# Patient Record
Sex: Female | Born: 1970 | Race: White | Hispanic: No | Marital: Married | Smoking: Former smoker
Health system: Southern US, Community
[De-identification: ages and names within clinical notes are randomized; demographics above are authoritative.]

## PROBLEM LIST (undated history)

## (undated) DIAGNOSIS — F419 Anxiety disorder, unspecified: Secondary | ICD-10-CM

## (undated) DIAGNOSIS — Z720 Tobacco use: Secondary | ICD-10-CM

## (undated) DIAGNOSIS — K219 Gastro-esophageal reflux disease without esophagitis: Secondary | ICD-10-CM

## (undated) HISTORY — DX: Gastro-esophageal reflux disease without esophagitis: K21.9

## (undated) HISTORY — PX: BREAST SURGERY: SHX581

## (undated) HISTORY — PX: OTHER SURGICAL HISTORY: SHX169

## (undated) HISTORY — DX: Anxiety disorder, unspecified: F41.9

## (undated) HISTORY — DX: Tobacco use: Z72.0

---

## 1997-10-26 ENCOUNTER — Other Ambulatory Visit: Admission: RE | Admit: 1997-10-26 | Discharge: 1997-10-26 | Payer: Self-pay | Admitting: Obstetrics & Gynecology

## 1998-11-12 ENCOUNTER — Other Ambulatory Visit: Admission: RE | Admit: 1998-11-12 | Discharge: 1998-11-12 | Payer: Self-pay | Admitting: Obstetrics & Gynecology

## 2002-04-26 ENCOUNTER — Inpatient Hospital Stay (HOSPITAL_COMMUNITY): Admission: AD | Admit: 2002-04-26 | Discharge: 2002-04-26 | Payer: Self-pay | Admitting: Obstetrics & Gynecology

## 2002-04-26 ENCOUNTER — Ambulatory Visit (HOSPITAL_COMMUNITY): Admission: RE | Admit: 2002-04-26 | Discharge: 2002-04-26 | Payer: Self-pay | Admitting: Obstetrics & Gynecology

## 2002-04-26 ENCOUNTER — Encounter: Payer: Self-pay | Admitting: Obstetrics & Gynecology

## 2002-06-19 ENCOUNTER — Ambulatory Visit (HOSPITAL_COMMUNITY): Admission: RE | Admit: 2002-06-19 | Discharge: 2002-06-19 | Payer: Self-pay | Admitting: Obstetrics

## 2002-06-19 ENCOUNTER — Encounter: Payer: Self-pay | Admitting: Obstetrics

## 2002-07-27 ENCOUNTER — Encounter: Payer: Self-pay | Admitting: Obstetrics & Gynecology

## 2002-07-27 ENCOUNTER — Ambulatory Visit (HOSPITAL_COMMUNITY): Admission: RE | Admit: 2002-07-27 | Discharge: 2002-07-27 | Payer: Self-pay | Admitting: Obstetrics & Gynecology

## 2002-09-06 ENCOUNTER — Encounter: Payer: Self-pay | Admitting: Obstetrics & Gynecology

## 2002-09-06 ENCOUNTER — Ambulatory Visit (HOSPITAL_COMMUNITY): Admission: RE | Admit: 2002-09-06 | Discharge: 2002-09-06 | Payer: Self-pay | Admitting: Obstetrics & Gynecology

## 2002-10-02 ENCOUNTER — Observation Stay (HOSPITAL_COMMUNITY): Admission: EM | Admit: 2002-10-02 | Discharge: 2002-10-03 | Payer: Self-pay | Admitting: Obstetrics & Gynecology

## 2002-11-16 ENCOUNTER — Encounter: Payer: Self-pay | Admitting: Obstetrics

## 2002-11-16 ENCOUNTER — Inpatient Hospital Stay (HOSPITAL_COMMUNITY): Admission: RE | Admit: 2002-11-16 | Discharge: 2002-11-16 | Payer: Self-pay | Admitting: Obstetrics

## 2002-11-18 ENCOUNTER — Inpatient Hospital Stay (HOSPITAL_COMMUNITY): Admission: AD | Admit: 2002-11-18 | Discharge: 2002-11-21 | Payer: Self-pay | Admitting: Obstetrics

## 2004-07-21 ENCOUNTER — Emergency Department (HOSPITAL_COMMUNITY): Admission: EM | Admit: 2004-07-21 | Discharge: 2004-07-21 | Payer: Self-pay | Admitting: Emergency Medicine

## 2004-07-24 ENCOUNTER — Ambulatory Visit (HOSPITAL_COMMUNITY): Admission: RE | Admit: 2004-07-24 | Discharge: 2004-07-24 | Payer: Self-pay | Admitting: Obstetrics & Gynecology

## 2006-02-08 ENCOUNTER — Emergency Department (HOSPITAL_COMMUNITY): Admission: EM | Admit: 2006-02-08 | Discharge: 2006-02-08 | Payer: Self-pay | Admitting: Family Medicine

## 2006-03-23 HISTORY — PX: AUGMENTATION MAMMAPLASTY: SUR837

## 2008-03-02 ENCOUNTER — Ambulatory Visit: Payer: Self-pay

## 2008-05-04 ENCOUNTER — Inpatient Hospital Stay (HOSPITAL_COMMUNITY): Admission: AD | Admit: 2008-05-04 | Discharge: 2008-05-04 | Payer: Self-pay | Admitting: Obstetrics

## 2008-06-04 ENCOUNTER — Ambulatory Visit: Payer: Self-pay | Admitting: Obstetrics & Gynecology

## 2008-07-06 ENCOUNTER — Inpatient Hospital Stay (HOSPITAL_COMMUNITY): Admission: AD | Admit: 2008-07-06 | Discharge: 2008-07-08 | Payer: Self-pay | Admitting: Obstetrics & Gynecology

## 2008-07-06 ENCOUNTER — Encounter (INDEPENDENT_AMBULATORY_CARE_PROVIDER_SITE_OTHER): Payer: Self-pay | Admitting: Obstetrics

## 2010-04-13 ENCOUNTER — Encounter: Payer: Self-pay | Admitting: Obstetrics & Gynecology

## 2010-07-02 LAB — CBC
HCT: 30.4 % — ABNORMAL LOW (ref 36.0–46.0)
HCT: 34 % — ABNORMAL LOW (ref 36.0–46.0)
Hemoglobin: 10.4 g/dL — ABNORMAL LOW (ref 12.0–15.0)
Hemoglobin: 11.9 g/dL — ABNORMAL LOW (ref 12.0–15.0)
MCHC: 34.4 g/dL (ref 30.0–36.0)
MCHC: 34.9 g/dL (ref 30.0–36.0)
MCV: 87.6 fL (ref 78.0–100.0)
MCV: 88.6 fL (ref 78.0–100.0)
Platelets: 203 10*3/uL (ref 150–400)
RBC: 3.43 MIL/uL — ABNORMAL LOW (ref 3.87–5.11)
RBC: 3.88 MIL/uL (ref 3.87–5.11)
RDW: 12.7 % (ref 11.5–15.5)
WBC: 10.8 10*3/uL — ABNORMAL HIGH (ref 4.0–10.5)
WBC: 9.5 10*3/uL (ref 4.0–10.5)

## 2010-07-02 LAB — RH IMMUNE GLOB WKUP(>/=20WKS)(NOT WOMEN'S HOSP)

## 2010-07-08 LAB — RH IMMUNE GLOBULIN WORKUP (NOT WOMEN'S HOSP)
ABO/RH(D): O NEG
Antibody Screen: NEGATIVE

## 2010-08-08 NOTE — Op Note (Signed)
Penny Reid, Penny Reid            ACCOUNT NO.:  [id number]   MEDICAL RECORD NO.:  [id number]          PATIENT TYPE:  AMB   LOCATION:  SDC                           FACILITY:  [facility]   PHYSICIAN:  Roseanna Rainbow, M.D.DATE OF BIRTH:  02-28-1971   DATE OF PROCEDURE:  07/24/2004  DATE OF DISCHARGE:                                 OPERATIVE REPORT   PREOPERATIVE DIAGNOSIS:  Left sided labia majora abscess.   POSTOPERATIVE DIAGNOSIS:  Left sided labia majora abscess.   PROCEDURE:  Incision and drainage of a left labia majora abscess.   SURGEON:  Tamela Oddi.   ANESTHESIA:  Laryngeal mask airway.   COMPLICATIONS:  None.   ESTIMATED BLOOD LOSS:  Minimal.   FINDINGS:  There was purulent drainage noted from the previous cruciate  incision made over the abscess. There was a fluctuant mass approximately 2-3  cm in diameter at the most dependent portion of the left labia majora.   PROCEDURE:  The patient is taken to the operating room. She was placed in  the dorsal lithotomy position and prepped and draped in the usual sterile  fashion. The previous cruciate incision was then extended anteriorly and  posteriorly.  The abscessed cavity was then probed with mosquito clamps.  The abscess cavity was evacuated. The cavity was then irrigated. The cavity  then was packed. At the close of the procedure the instrument and pack  counts were said to be correct x2. The patient was taken to the PACU in  stable condition.      LAJ/MEDQ  D:  07/24/2004  T:  07/24/2004  Job:  [id number]

## 2010-08-08 NOTE — H&P (Signed)
Penny Reid, [name]            ACCOUNT NO.:  [id number]   MEDICAL RECORD NO.:  [id number]          PATIENT TYPE:  AMB   LOCATION:  SDC                           [facility]:  [facility]   PHYSICIAN:  Roseanna Rainbow, M.D.DATE OF BIRTH:  Aug 19, 1970   DATE OF ADMISSION:  DATE OF DISCHARGE:                                HISTORY & PHYSICAL   CHIEF COMPLAINT:  The patient is a 40 year old Caucasian female with a left  sided labial abscess who presents for incision and drainage.   HISTORY OF PRESENT ILLNESS:  Please see the above.  The patient reports  swelling of the left labia and pain for several days.  She is status post an  attempt at I&D in the office two days prior.  She has no previous history of  any other labial abscesses.   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   PAST OBSTETRICAL/GYNECOLOGIC HISTORY:  1.  Please see the above.  2.  She is status post two NSVs.   PAST MEDICAL HISTORY:  She denies.   MEDICATIONS:  Augmentin, ibuprofen.   ALLERGIES:  No known drug allergies.   SOCIAL HISTORY:  She reports current tobacco use.  Negative for alcohol or  recreational drugs.  She is married.   FAMILY HISTORY:  Diabetes mellitus.   PHYSICAL EXAMINATION:  VITAL SIGNS:  Stable afebrile.  GENERAL:  A well-developed, well-nourished, Caucasian female in no apparent  distress.  LUNGS:  Clear to auscultation bilaterally.  HEART:  Regular rate and rhythm.  ABDOMEN:  No organomegaly.  PELVIC:  The left labia majus is erythematous.  There is a fluctuant mass in  the most dependent portion measuring about 2- to -3-cm in diameter that is  tender and draining purulent material.   ASSESSMENT:  Left sided labial abscess.   PLAN:  Incision and drainage under anesthesia.  The risks, benefits, and  alternatives forms of management were reviewed with the patient and informed  consent has been obtained.      LAJ/MEDQ  D:  07/23/2004  T:  07/23/2004   Job:  [id number]

## 2010-08-08 NOTE — Discharge Summary (Signed)
Penny Reid, Penny Reid                        ACCOUNT NO.:  [id number]   MEDICAL RECORD NO.:  [id number]                   PATIENT TYPE:  INP   LOCATION:  9138                                 [facility]ACILITY:  [facility]   PHYSICIAN:  Charles A. Clearance Coots, M.D.             DATE OF BIRTH:  25-Jan-1971   DATE OF ADMISSION:  11/18/2002  DATE OF DISCHARGE:  11/21/2002                                 DISCHARGE SUMMARY   ADMITTING DIAGNOSES:  1. Term pregnancy.  2. Active labor.   DISCHARGE DIAGNOSES:  1. Term pregnancy.  2. Active labor.  3. Status post normal spontaneous vaginal delivery viable female infant on     November 19, 2002 at 0542.  Apgars of 9 at one minute, 9 at five minutes.     Weight of 3500 g.  Length of 53.5 cm.  Mother and infant discharged home     in good condition.   REASON FOR ADMISSION:  A 40 year old white female G2, P0-1-0-1 with  estimated date of confinement of November 14, 2002 presented to [facility] with rupture of membranes on the night of November 18, 2002.  She  progressed to full dilatation by the following morning and had a normal  spontaneous vaginal delivery of viable infant without complications.  Postpartum course was uncomplicated and the patient was discharged home on  postpartum day #2 in good condition.   PAST SURGICAL HISTORY:  Cryo cautery of the cervix for cervical dysplasia.   PAST MEDICAL HISTORY:  None.   MEDICATIONS:  Prenatal vitamins.   ALLERGIES:  No known drug allergies.   SOCIAL HISTORY:  Positive tobacco.  Negative for alcohol or recreational  drugs.   PHYSICAL EXAMINATION:  GENERAL:  Well-nourished, well-developed white female  in no acute distress.  HEENT:  Within normal limits.  LUNGS:  Clear to auscultation bilaterally.  HEART:  Regular rate and rhythm.  ABDOMEN:  Gravid, soft, nontender.  PELVIC:  Cervix:  Anterior lip, 100% effaced and the vertex was at a -1  station.   HOSPITAL COURSE:  As stated in the narrative, the  patient progressed very  rapidly thereafter to a normal spontaneous vaginal delivery without  complications.  The postoperative course was also uncomplicated.  She was  discharged home on postpartum day #2.   ADMITTING LABORATORY VALUES:  Hemoglobin 11.8, hematocrit 34.2, white blood  cell count 10,200, platelets 240,000.  RPR was nonreactive.   DISCHARGE LABORATORY VALUES:  Hemoglobin 11.4, hematocrit 33.0, white blood  cell count 13,900, platelets 176,000.   DISCHARGE DISPOSITION:  Continue prenatal vitamins.  Ibuprofen 800 mg q.8h.  as needed for pain.  Routine written obstetrical instructions per booklet  were given to the patient prior to discharge.  She is to call the office for  a follow-up appointment in six weeks.  Charles A. Clearance Coots, M.D.    CAH/MEDQ  D:  11/21/2002  T:  11/21/2002  Job:  [id number]

## 2010-08-08 NOTE — H&P (Signed)
NAMEGRETTELL, Penny Reid            ACCOUNT NO.:  [id number]   MEDICAL RECORD NO.:  [id number]           PATIENT TYPE:   LOCATION:                                 FACILITY:   PHYSICIAN:  Roseanna Rainbow, M.D.DATE OF BIRTH:  Nov 12, 1970   DATE OF ADMISSION:  DATE OF DISCHARGE:                                HISTORY & PHYSICAL   CHIEF COMPLAINT:  The patient is a 40 year old Caucasian female with a left-  sided labial abscess who presents for incision and drainage.   HISTORY OF PRESENT ILLNESS:  Dictation ended at this point.      LAJ/MEDQ  D:  07/23/2004  T:  07/23/2004  Job:  [id number]

## 2012-06-28 ENCOUNTER — Emergency Department (HOSPITAL_COMMUNITY)
Admission: EM | Admit: 2012-06-28 | Discharge: 2012-06-28 | Disposition: A | Discharge status: Home / Self Care | Attending: Emergency Medicine | Admitting: Emergency Medicine

## 2012-06-28 ENCOUNTER — Emergency Department (HOSPITAL_COMMUNITY)

## 2012-06-28 ENCOUNTER — Encounter (HOSPITAL_COMMUNITY): Payer: Self-pay | Admitting: *Deleted

## 2012-06-28 DIAGNOSIS — S0180XA Unspecified open wound of other part of head, initial encounter: Secondary | ICD-10-CM | POA: Insufficient documentation

## 2012-06-28 DIAGNOSIS — R110 Nausea: Secondary | ICD-10-CM | POA: Insufficient documentation

## 2012-06-28 DIAGNOSIS — Z23 Encounter for immunization: Secondary | ICD-10-CM | POA: Insufficient documentation

## 2012-06-28 DIAGNOSIS — Y92838 Other recreation area as the place of occurrence of the external cause: Secondary | ICD-10-CM | POA: Insufficient documentation

## 2012-06-28 DIAGNOSIS — F17200 Nicotine dependence, unspecified, uncomplicated: Secondary | ICD-10-CM | POA: Insufficient documentation

## 2012-06-28 DIAGNOSIS — W19XXXA Unspecified fall, initial encounter: Secondary | ICD-10-CM

## 2012-06-28 DIAGNOSIS — S0990XA Unspecified injury of head, initial encounter: Secondary | ICD-10-CM | POA: Insufficient documentation

## 2012-06-28 DIAGNOSIS — Y9239 Other specified sports and athletic area as the place of occurrence of the external cause: Secondary | ICD-10-CM | POA: Insufficient documentation

## 2012-06-28 DIAGNOSIS — IMO0002 Reserved for concepts with insufficient information to code with codable children: Secondary | ICD-10-CM | POA: Insufficient documentation

## 2012-06-28 DIAGNOSIS — Y9353 Activity, golf: Secondary | ICD-10-CM | POA: Insufficient documentation

## 2012-06-28 MED ORDER — TETANUS-DIPHTH-ACELL PERTUSSIS 5-2.5-18.5 LF-MCG/0.5 IM SUSP
0.5000 mL | Freq: Once | INTRAMUSCULAR | Status: AC
  Administered 2012-06-28: 0.5 mL via INTRAMUSCULAR
  Filled 2012-06-28: qty 0.5

## 2012-06-28 MED ORDER — ACETAMINOPHEN 325 MG PO TABS
650.0000 mg | ORAL_TABLET | Freq: Once | ORAL | Status: AC
  Administered 2012-06-28: 650 mg via ORAL
  Filled 2012-06-28: qty 2
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
## 2012-06-28 NOTE — ED Notes (Signed)
Pt to CT
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
## 2012-06-28 NOTE — ED Notes (Signed)
Suture cart at bedside 

## 2012-06-28 NOTE — ED Notes (Signed)
Pt comfortable with d/c and f/u instructions. 

## 2012-07-04 ENCOUNTER — Encounter: Payer: Self-pay | Admitting: Family Medicine

## 2012-07-05 NOTE — ED Provider Notes (Signed)
Medical screening examination/treatment/procedure(s) were performed by non-physician practitioner and as supervising physician I was immediately available for consultation/collaboration.  Leiyah Maultsby, MD 07/05/12 0812 

## 2012-07-06 ENCOUNTER — Ambulatory Visit (INDEPENDENT_AMBULATORY_CARE_PROVIDER_SITE_OTHER): Admitting: Physician Assistant

## 2012-07-06 ENCOUNTER — Encounter: Payer: Self-pay | Admitting: Physician Assistant

## 2012-07-06 VITALS — Temp 97.8°F | Ht 64.5 in | Wt 140.0 lb

## 2012-07-06 DIAGNOSIS — Z4802 Encounter for removal of sutures: Secondary | ICD-10-CM
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
## 2012-09-09 ENCOUNTER — Ambulatory Visit (INDEPENDENT_AMBULATORY_CARE_PROVIDER_SITE_OTHER): Admitting: Family Medicine

## 2012-09-09 ENCOUNTER — Encounter: Payer: Self-pay | Admitting: Family Medicine

## 2012-09-09 DIAGNOSIS — H6120 Impacted cerumen, unspecified ear: Secondary | ICD-10-CM

## 2012-09-09 DIAGNOSIS — H918X9 Other specified hearing loss, unspecified ear: Secondary | ICD-10-CM
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
## 2012-11-06 ENCOUNTER — Emergency Department (HOSPITAL_COMMUNITY)

## 2012-11-06 ENCOUNTER — Encounter (HOSPITAL_COMMUNITY): Payer: Self-pay | Admitting: *Deleted

## 2012-11-06 ENCOUNTER — Emergency Department (HOSPITAL_COMMUNITY)
Admission: EM | Admit: 2012-11-06 | Discharge: 2012-11-06 | Disposition: A | Discharge status: Home / Self Care | Attending: Emergency Medicine | Admitting: Emergency Medicine

## 2012-11-06 DIAGNOSIS — R42 Dizziness and giddiness: Secondary | ICD-10-CM | POA: Insufficient documentation

## 2012-11-06 DIAGNOSIS — R112 Nausea with vomiting, unspecified: Secondary | ICD-10-CM | POA: Insufficient documentation

## 2012-11-06 DIAGNOSIS — H81399 Other peripheral vertigo, unspecified ear: Secondary | ICD-10-CM

## 2012-11-06 DIAGNOSIS — H539 Unspecified visual disturbance: Secondary | ICD-10-CM | POA: Insufficient documentation

## 2012-11-06 DIAGNOSIS — F17200 Nicotine dependence, unspecified, uncomplicated: Secondary | ICD-10-CM | POA: Insufficient documentation

## 2012-11-06 LAB — CBC WITH DIFFERENTIAL/PLATELET
Basophils Absolute: 0 10*3/uL (ref 0.0–0.1)
Basophils Relative: 0 % (ref 0–1)
Eosinophils Absolute: 0 10*3/uL (ref 0.0–0.7)
Eosinophils Relative: 0 % (ref 0–5)
HCT: 43.7 % (ref 36.0–46.0)
Hemoglobin: 15.9 g/dL — ABNORMAL HIGH (ref 12.0–15.0)
Lymphocytes Relative: 7 % — ABNORMAL LOW (ref 12–46)
Lymphs Abs: 0.8 10*3/uL (ref 0.7–4.0)
MCH: 31.5 pg (ref 26.0–34.0)
MCHC: 36.4 g/dL — ABNORMAL HIGH (ref 30.0–36.0)
MCV: 86.7 fL (ref 78.0–100.0)
Monocytes Absolute: 0.5 10*3/uL (ref 0.1–1.0)
Monocytes Relative: 5 % (ref 3–12)
Neutro Abs: 9.5 10*3/uL — ABNORMAL HIGH (ref 1.7–7.7)
Neutrophils Relative %: 88 % — ABNORMAL HIGH (ref 43–77)
Platelets: 205 10*3/uL (ref 150–400)
RBC: 5.04 MIL/uL (ref 3.87–5.11)
RDW: 12.9 % (ref 11.5–15.5)
WBC: 10.8 10*3/uL — ABNORMAL HIGH (ref 4.0–10.5)

## 2012-11-06 LAB — COMPREHENSIVE METABOLIC PANEL
ALT: 14 U/L (ref 0–35)
AST: 18 U/L (ref 0–37)
Albumin: 4.5 g/dL (ref 3.5–5.2)
Alkaline Phosphatase: 40 U/L (ref 39–117)
GFR calc Af Amer: >90 mL/min (ref >90)
Glucose, Bld: 100 mg/dL — ABNORMAL HIGH (ref 70–99)
Potassium: 3.7 mEq/L (ref 3.5–5.1)
Sodium: 140 mEq/L (ref 135–145)
Total Protein: 7.2 g/dL (ref 6.0–8.3)

## 2012-11-06 LAB — URINALYSIS, ROUTINE W REFLEX MICROSCOPIC
Glucose, UA: NEGATIVE mg/dL
Hgb urine dipstick: NEGATIVE
Specific Gravity, Urine: 1.029 (ref 1.005–1.030)
pH: 5.5 (ref 5.0–8.0)

## 2012-11-06 MED ORDER — ONDANSETRON 4 MG PO TBDP
8.0000 mg | ORAL_TABLET | Freq: Once | ORAL | Status: AC
  Administered 2012-11-06: 8 mg via ORAL

## 2012-11-06 MED ORDER — MECLIZINE HCL 25 MG PO TABS
25.0000 mg | ORAL_TABLET | Freq: Once | ORAL | Status: AC
  Administered 2012-11-06: 25 mg via ORAL
  Filled 2012-11-06: qty 1

## 2012-11-06 MED ORDER — LORAZEPAM 2 MG/ML IJ SOLN: 0.5000 mg | Freq: Once | INTRAMUSCULAR | Status: DC

## 2012-11-06 MED ORDER — SODIUM CHLORIDE 0.9 % IV BOLUS (SEPSIS): 1000.0000 mL | Freq: Once | INTRAVENOUS | Status: DC

## 2012-11-06 MED ORDER — ONDANSETRON 4 MG PO TBDP
ORAL_TABLET | ORAL | Status: AC
  Filled 2012-11-06: qty 2

## 2012-11-06 MED ORDER — SODIUM CHLORIDE 0.9 % IV BOLUS (SEPSIS)
1000.0000 mL | Freq: Once | INTRAVENOUS | Status: AC
  Administered 2012-11-06: 1000 mL via INTRAVENOUS

## 2012-11-06 MED ORDER — MECLIZINE HCL 25 MG PO TABS: 25.0000 mg | ORAL_TABLET | Freq: Three times a day (TID) | ORAL | Status: DC | PRN

## 2012-11-06 NOTE — ED Notes (Signed)
Attempted to collect urine sample; however, pt states she is unable to void.

## 2012-11-06 NOTE — ED Notes (Signed)
The pt is stil inxray

## 2012-11-06 NOTE — ED Provider Notes (Signed)
CSN: [id number]     Arrival date & time 11/06/12  2130 History     First MD Initiated Contact with Patient 11/06/12 [phone]     Chief Complaint  Patient presents with  . Dizziness  . Emesis   (Consider location/radiation/quality/duration/timing/severity/associated sxs/prior Treatment) HPI Pt with acute onset dizziness yesterday morning when she woke and turned over. Sensation of room spinning. +nausea and several episodes of vomiting. Dizziness described as room spinning. Worse when turning head and changing position. No hearing changes. No focal weakness, numbness or facial asymetry. Pt has been ambulatory at home. No fever chills, HA, or neck pain/stiffness  History reviewed. No pertinent past medical history. Past Surgical History  Procedure Laterality Date  . Breast surgery      breast augmentation 11/08  . Other surgical history      removal  of MRSA lesion 4/06   History reviewed. No pertinent family history. History  Substance Use Topics  . Smoking status: Current Every Day Smoker  . Smokeless tobacco: Not on file  . Alcohol Use: Yes   OB History   Grav Para Term Preterm Abortions TAB SAB Ect Mult Living                 Review of Systems  Constitutional: Negative for fever and chills.  HENT: Negative for hearing loss, ear pain, sore throat, neck pain, neck stiffness and tinnitus.   Eyes: Positive for visual disturbance.  Respiratory: Negative for cough and shortness of breath.   Cardiovascular: Negative for chest pain, palpitations and leg swelling.  Gastrointestinal: Positive for nausea and vomiting. Negative for abdominal pain, diarrhea and constipation.  Genitourinary: Negative for dysuria and frequency.  Musculoskeletal: Negative for myalgias, back pain, joint swelling, arthralgias and gait problem.  Skin: Negative for pallor, rash and wound.  Neurological: Positive for dizziness. Negative for syncope, weakness, light-headedness, numbness and headaches.  All other  systems reviewed and are negative.    Allergies  Review of patient's allergies indicates no known allergies.  Home Medications  No current outpatient prescriptions on file. BP 108/71  Pulse 97  Temp(Src) 98 F (36.7 C) (Oral)  Resp 18  SpO2 100% Physical Exam  Nursing note and vitals reviewed. Constitutional: She is oriented to person, place, and time. She appears well-developed and well-nourished. No distress.  HENT:  Head: Normocephalic and atraumatic.  Right Ear: External ear normal.  Left Ear: External ear normal.  Mouth/Throat: Oropharynx is clear and moist.  Eyes: EOM are normal. Pupils are equal, round, and reactive to light.  fatigable horizontal nystagmus  Neck: Normal range of motion. Neck supple.  Cardiovascular: Normal rate and regular rhythm.   Pulmonary/Chest: Effort normal and breath sounds normal. No respiratory distress. She has no wheezes. She has no rales. She exhibits no tenderness.  Abdominal: Soft. Bowel sounds are normal. She exhibits no distension and no mass. There is no tenderness. There is no rebound and no guarding.  Musculoskeletal: Normal range of motion. She exhibits no edema and no tenderness.  Neurological: She is alert and oriented to person, place, and time.  CN II-XII intact, finger to nose intact, sensation intact, 5/5 motor in all ext. Vertigo worsened with sitting up in bed.   Skin: Skin is warm and dry. No rash noted. No erythema.  Psychiatric: She has a normal mood and affect. Her behavior is normal.    ED Course   Procedures (including critical care time)  Labs Reviewed  CBC WITH DIFFERENTIAL - Abnormal; Notable for  the following:    WBC 10.8 (*)    Hemoglobin 15.9 (*)    MCHC 36.4 (*)    Neutrophils Relative % 88 (*)    Neutro Abs 9.5 (*)    Lymphocytes Relative 7 (*)    All other components within normal limits  COMPREHENSIVE METABOLIC PANEL  LIPASE, BLOOD  URINALYSIS, ROUTINE W REFLEX MICROSCOPIC   No results found. No  diagnosis found.  MDM  Suspect peripheral cause to pt vertigo. Will treat symptomatically.   Pt with persistent vertigo despite medication. Will MRI to r/o post fossa abnormality.   Sign out to oncoming EDP pending MRI.   Loren Racer, MD 11/06/12 [phone]

## 2012-11-06 NOTE — ED Provider Notes (Signed)
7:27 PM MRI negative for central cause of vertigo. Patient still feels a little dizzy but feels comfortable going home. Will d/c with antivert.  MR BRAIN WO CONTRAST   Final Result:       IMPRESSION: Normal appearance of the brain itself. No posterior circulation infarction.  Some fluid in the petrous apex air cells on the right. Usually, this is not of clinical significance.   Audree Camel, MD 11/07/12 [phone]

## 2012-11-06 NOTE — ED Notes (Signed)
The pt reports that she does not feel any better.  Waiting for mri.  Family at the bedside

## 2012-11-06 NOTE — ED Notes (Signed)
Pt alert, NAD, calm, "feels better", reports only mild dizziness remains, "not like before", denies pain or nausea. Dressed self. Transferred self to w/c. Out with husband. VSS. Denies questions. EDP in to see pt at time of d/c. Out in w/c, given Rx. Alert, NAD, calm, interactive.

## 2012-11-06 NOTE — ED Notes (Signed)
Pt reports waking up yesterday morning with chills/ hot sweats, dizziness and n/v. "feels like the whole room is spinning." n/v on arrival to ed.

## 2012-11-06 NOTE — ED Notes (Signed)
The pt returned from mri more alert now.  She reports and appears to feel better .  She is now smiling and she reports that the room is not spinning as before.  She no longer has a headache
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
## 2012-11-06 NOTE — ED Notes (Signed)
Pt has been taken to [facility]

## 2012-11-10 ENCOUNTER — Ambulatory Visit (INDEPENDENT_AMBULATORY_CARE_PROVIDER_SITE_OTHER): Admitting: Family Medicine

## 2012-11-10 ENCOUNTER — Encounter: Payer: Self-pay | Admitting: Family Medicine

## 2012-11-10 VITALS — BP 110/68 | HR 72 | Temp 98.1°F | Resp 16 | Wt 136.0 lb

## 2012-11-10 DIAGNOSIS — H8110 Benign paroxysmal vertigo, unspecified ear: Secondary | ICD-10-CM

## 2012-11-10 NOTE — Progress Notes (Signed)
  Subjective:    Patient ID: Penny Reid, female    DOB: 23-Jun-1970, 42 y.o.   MRN: [id number]  HPI The patient was in her normal state of health. However when she was Saturday she went over in bed in semi-experienced extreme vertigo. The room was spinning around her. She became extremely nauseated and vomited. The remainder of the day Saturday movement triggered severe vertigo. By Sunday she went to the emergency room. There she was diagnosed with dehydration and was given IV fluids. An MRI of the brain was negative. She was discharged on meclizine for vertigo. She states she is feeling somewhat better now although she still has disequilibrium with movement. No past medical history on file. Current Outpatient Prescriptions on File Prior to Visit  Medication Sig Dispense Refill  . meclizine (ANTIVERT) 25 MG tablet Take 1 tablet (25 mg total) by mouth 3 (three) times daily as needed for dizziness.  30 tablet  0   No current facility-administered medications on file prior to visit.   No Known Allergies History   Social History  . Marital Status: Married    Spouse Name: N/A    Number of Children: N/A  . Years of Education: N/A   Occupational History  . Not on file.   Social History Main Topics  . Smoking status: Current Every Day Smoker  . Smokeless tobacco: Not on file  . Alcohol Use: Yes  . Drug Use: No  . Sexual Activity: Yes    Birth Control/ Protection: None   Other Topics Concern  . Not on file   Social History Narrative  . No narrative on file      Review of Systems  All other systems reviewed and are negative.       Objective:   Physical Exam  Vitals reviewed. Constitutional: She is oriented to person, place, and time. She appears well-developed and well-nourished.  HENT:  Head: Normocephalic.  Right Ear: External ear normal.  Left Ear: External ear normal.  Nose: Nose normal.  Mouth/Throat: Oropharynx is clear and moist. No oropharyngeal exudate.   Eyes: Conjunctivae and EOM are normal. Pupils are equal, round, and reactive to light. Right eye exhibits no discharge. Left eye exhibits no discharge.  Neck: No thyromegaly present.  Cardiovascular: Normal rate, regular rhythm and normal heart sounds.   Pulmonary/Chest: Effort normal and breath sounds normal. No respiratory distress. She has no wheezes. She has no rales. She exhibits no tenderness.  Abdominal: Soft. Bowel sounds are normal. She exhibits no distension. There is no tenderness. There is no rebound and no guarding.  Neurological: She is alert and oriented to person, place, and time. She has normal reflexes. She displays normal reflexes. No cranial nerve deficit. Coordination normal.   Dix-Hallpike maneuver is negative.        Assessment & Plan:  1. BPPV (benign paroxysmal positional vertigo) I suspect BPPV. Spent 15 minutes explaining the cause of BPPV and the natural history of the disorder. His meclizine 25 mg every 6 hours when necessary until symptoms improve. I expect self-limited resolution in one week. If symptoms do not improve, I would then recommend Epley maneuver.

## 2013-01-26 ENCOUNTER — Other Ambulatory Visit: Payer: Self-pay

## 2013-03-07 ENCOUNTER — Ambulatory Visit (INDEPENDENT_AMBULATORY_CARE_PROVIDER_SITE_OTHER): Admitting: Family Medicine

## 2013-03-07 ENCOUNTER — Encounter: Payer: Self-pay | Admitting: Family Medicine

## 2013-03-07 VITALS — BP 110/74 | HR 78 | Temp 97.0°F | Resp 16 | Ht 66.0 in | Wt 140.0 lb

## 2013-03-07 DIAGNOSIS — M7710 Lateral epicondylitis, unspecified elbow: Secondary | ICD-10-CM

## 2013-03-07 DIAGNOSIS — M7711 Lateral epicondylitis, right elbow: Secondary | ICD-10-CM

## 2013-03-07 DIAGNOSIS — R0982 Postnasal drip: Secondary | ICD-10-CM

## 2013-03-07 MED ORDER — FLUTICASONE PROPIONATE 50 MCG/ACT NA SUSP: 2.0000 | Freq: Every day | NASAL | Status: DC

## 2013-03-07 MED ORDER — OXAPROZIN 600 MG PO TABS: 1200.0000 mg | ORAL_TABLET | Freq: Every day | ORAL | Status: DC

## 2013-03-07 MED ORDER — HYDROCODONE-HOMATROPINE 5-1.5 MG/5ML PO SYRP: 5.0000 mL | ORAL_SOLUTION | Freq: Three times a day (TID) | ORAL | Status: DC | PRN

## 2013-03-07 NOTE — Progress Notes (Signed)
   Subjective:    Patient ID: Penny Reid, female    DOB: 10-31-1970, 42 y.o.   MRN: [id number]  HPI Patient presents with several years of right elbow pain. The pain is episodic. However over the last month it has become constant. The pain is located over the lateral epicondyles. It is worse with gripping objects and flexion and extension of the wrist. She is exquisitely tender to palpation over the lateral epicondyles. She denies any fall or trauma to that area. She also reports 3 weeks of cough and postnasal drip. She denies any sinus pressure or sinus pain. She denies any fevers or chills. She denies any shortness of breath or pleurisy. No past medical history on file. No current outpatient prescriptions on file prior to visit.   No current facility-administered medications on file prior to visit.   No Known Allergies History   Social History  . Marital Status: Married    Spouse Name: N/A    Number of Children: N/A  . Years of Education: N/A   Occupational History  . Not on file.   Social History Main Topics  . Smoking status: Current Every Day Smoker  . Smokeless tobacco: Not on file  . Alcohol Use: Yes  . Drug Use: No  . Sexual Activity: Yes    Birth Control/ Protection: None   Other Topics Concern  . Not on file   Social History Narrative  . No narrative on file      Review of Systems  All other systems reviewed and are negative.       Objective:   Physical Exam  Vitals reviewed. Constitutional: She appears well-developed and well-nourished.  HENT:  Right Ear: External ear normal.  Left Ear: External ear normal.  Nose: Nose normal.  Mouth/Throat: Oropharynx is clear and moist. No oropharyngeal exudate.  Neck: Neck supple.  Cardiovascular: Normal rate, regular rhythm and normal heart sounds.  Exam reveals no gallop and no friction rub.   No murmur heard. Pulmonary/Chest: Effort normal and breath sounds normal. No respiratory distress. She has no  wheezes. She has no rales. She exhibits no tenderness.  Musculoskeletal:       Right elbow: She exhibits normal range of motion, no swelling and no effusion. Tenderness found. Lateral epicondyle tenderness noted.  Lymphadenopathy:    She has no cervical adenopathy.          Assessment & Plan:  1. Lateral epicondylitis (tennis elbow), right Patient has tennis elbow. She elects to wear an elbow strap for 2-4 weeks and take Daypro 1200 mg by mouth daily. If symptoms are not improving she will return for a cortisone injection - oxaprozin (DAYPRO) 600 MG tablet; Take 2 tablets (1,200 mg total) by mouth daily.  Dispense: 60 tablet; Refill: 0  2. Post-nasal drip Begin Flonase 2 sprays each nostril daily. Anticipate spontaneous resolution over the next week. I recommended Sudafed as needed for congestion. I recommended Hycodan 1 teaspoon every 8 hours as needed for cough

## 2017-05-21 ENCOUNTER — Encounter: Payer: Self-pay | Admitting: Family Medicine

## 2017-05-21 ENCOUNTER — Ambulatory Visit (INDEPENDENT_AMBULATORY_CARE_PROVIDER_SITE_OTHER): Admitting: Family Medicine

## 2017-05-21 VITALS — BP 122/68 | HR 97 | Temp 97.9°F | Resp 16 | Ht 66.0 in | Wt 154.0 lb

## 2017-05-21 DIAGNOSIS — Z0000 Encounter for general adult medical examination without abnormal findings: Secondary | ICD-10-CM | POA: Diagnosis not present

## 2017-05-21 DIAGNOSIS — Z124 Encounter for screening for malignant neoplasm of cervix: Secondary | ICD-10-CM

## 2017-05-21 LAB — COMPLETE METABOLIC PANEL WITH GFR
AG RATIO: 1.9 (calc) (ref 1.0–2.5)
ALBUMIN MSPROF: 4.4 g/dL (ref 3.6–5.1)
ALKALINE PHOSPHATASE (APISO): 35 U/L (ref 33–115)
ALT: 18 U/L (ref 6–29)
AST: 19 U/L (ref 10–35)
BUN: 15 mg/dL (ref 7–25)
CHLORIDE: 103 mmol/L (ref 98–110)
CO2: 27 mmol/L (ref 20–32)
Calcium: 9.9 mg/dL (ref 8.6–10.2)
Creat: 0.96 mg/dL (ref 0.50–1.10)
GFR, Est African American: 82 mL/min/{1.73_m2} (ref >=60)
GFR, Est Non African American: 70 mL/min/{1.73_m2} (ref >=60)
GLOBULIN: 2.3 g/dL (ref 1.9–3.7)
Glucose, Bld: 91 mg/dL (ref 65–99)
POTASSIUM: 4.7 mmol/L (ref 3.5–5.3)
SODIUM: 138 mmol/L (ref 135–146)
Total Bilirubin: 1.7 mg/dL — ABNORMAL HIGH (ref 0.2–1.2)
Total Protein: 6.7 g/dL (ref 6.1–8.1)

## 2017-05-21 LAB — LIPID PANEL
CHOL/HDL RATIO: 2.9 (calc) (ref <5.0)
CHOLESTEROL: 218 mg/dL — AB (ref <200)
HDL: 75 mg/dL (ref >50)
LDL Cholesterol (Calc): 127 mg/dL (calc) — ABNORMAL HIGH
Non-HDL Cholesterol (Calc): 143 mg/dL (calc) — ABNORMAL HIGH (ref <130)
Triglycerides: 72 mg/dL (ref <150)

## 2017-05-21 LAB — CBC WITH DIFFERENTIAL/PLATELET
BASOS ABS: 40 {cells}/uL (ref 0–200)
Basophils Relative: 0.7 %
Eosinophils Absolute: 120 cells/uL (ref 15–500)
Eosinophils Relative: 2.1 %
HCT: 45.3 % — ABNORMAL HIGH (ref 35.0–45.0)
HEMOGLOBIN: 14.9 g/dL (ref 11.7–15.5)
Lymphs Abs: 1414 cells/uL (ref 850–3900)
MCH: 28.5 pg (ref 27.0–33.0)
MCHC: 32.9 g/dL (ref 32.0–36.0)
MCV: 86.6 fL (ref 80.0–100.0)
MPV: 10.8 fL (ref 7.5–12.5)
Monocytes Relative: 10.3 %
NEUTROS ABS: 3540 {cells}/uL (ref 1500–7800)
NEUTROS PCT: 62.1 %
Platelets: 237 10*3/uL (ref 140–400)
RBC: 5.23 10*6/uL — ABNORMAL HIGH (ref 3.80–5.10)
RDW: 12 % (ref 11.0–15.0)
Total Lymphocyte: 24.8 %
WBC mixed population: 587 cells/uL (ref 200–950)
WBC: 5.7 10*3/uL (ref 3.8–10.8)

## 2017-05-21 NOTE — Progress Notes (Signed)
Subjective:    Patient ID: Penny Reid, female    DOB: October 07, 1970, 47 y.o.   MRN: [id number]  HPI Patient is a very pleasant 47 year old white female who is here today requesting referral for a Pap smear and her mammogram.  She already has an appointment to see her gynecologist but due to her insurance she needs a referral which I am happy to provide.  She has no family history of colon cancer and she denies any blood in her stool or melena.  Therefore she is not due yet for a colonoscopy.  She is due for a flu shot but she politely declines this.  Her tetanus shot was in 2014 and is up-to-date.  She declines HIV screening.  She is due for regular lab work She denies any significant past medical history other than childbirth. Past Surgical History:  Procedure Laterality Date  . BREAST SURGERY     breast augmentation 11/08  . OTHER SURGICAL HISTORY     removal  of MRSA lesion 4/06   No current outpatient medications on file prior to visit.   No current facility-administered medications on file prior to visit.    No Known Allergies Social History   Socioeconomic History  . Marital status: Married    Spouse name: Not on file  . Number of children: Not on file  . Years of education: Not on file  . Highest education level: Not on file  Social Needs  . Financial resource strain: Not on file  . Food insecurity - worry: Not on file  . Food insecurity - inability: Not on file  . Transportation needs - medical: Not on file  . Transportation needs - non-medical: Not on file  Occupational History  . Not on file  Tobacco Use  . Smoking status: Current Every Day Smoker  . Smokeless tobacco: Never Used  Substance and Sexual Activity  . Alcohol use: Yes  . Drug use: No  . Sexual activity: Yes    Birth control/protection: None  Other Topics Concern  . Not on file  Social History Narrative  . Not on file   Patient states that she quit smoking 1 year ago No family history on  file. She denies any family history of premature cardiovascular disease, colon cancer, breast cancer    Review of Systems  All other systems reviewed and are negative.      Objective:   Physical Exam  Constitutional: She is oriented to person, place, and time. She appears well-developed and well-nourished. No distress.  HENT:  Head: Normocephalic and atraumatic.  Right Ear: External ear normal.  Left Ear: External ear normal.  Nose: Nose normal.  Mouth/Throat: Oropharynx is clear and moist. No oropharyngeal exudate.  Eyes: Conjunctivae and EOM are normal. Pupils are equal, round, and reactive to light. Right eye exhibits no discharge. Left eye exhibits no discharge. No scleral icterus.  Neck: Normal range of motion. Neck supple. No JVD present. No tracheal deviation present. No thyromegaly present.  Cardiovascular: Normal rate, regular rhythm and normal heart sounds. Exam reveals no gallop and no friction rub.  No murmur heard. Pulmonary/Chest: Effort normal and breath sounds normal. No stridor. No respiratory distress. She has no wheezes. She has no rales. She exhibits no tenderness.  Abdominal: Soft. Bowel sounds are normal. She exhibits no distension and no mass. There is no tenderness. There is no rebound and no guarding.  Musculoskeletal: She exhibits no edema, tenderness or deformity.  Lymphadenopathy:  She has no cervical adenopathy.  Neurological: She is alert and oriented to person, place, and time. She has normal reflexes. She displays normal reflexes. No cranial nerve deficit. She exhibits normal muscle tone. Coordination normal.  Skin: Skin is warm. No rash noted. She is not diaphoretic. No erythema. No pallor.  Psychiatric: She has a normal mood and affect. Her behavior is normal. Judgment and thought content normal.  Vitals reviewed.         Assessment & Plan:  General medical exam - Plan: CBC with Differential/Platelet, COMPLETE METABOLIC PANEL WITH GFR, Lipid  panel  Cervical cancer screening - Plan: Ambulatory referral to Gynecology  Physical exam today is completely normal.  I congratulated the patient on smoking cessation.  I will happily schedule her to meet with a gynecologist for her Pap smear.  She defers to their office to perform a mammogram.  She is not yet due for colonoscopy.  While she is here, I will check a CBC, CMP, and fasting lipid panel.  She declines HIV screening.  She also declines a flu shot.

## 2017-05-24 ENCOUNTER — Encounter: Payer: Self-pay | Admitting: Family Medicine

## 2017-06-30 ENCOUNTER — Other Ambulatory Visit: Payer: Self-pay | Admitting: Obstetrics and Gynecology

## 2017-06-30 DIAGNOSIS — R928 Other abnormal and inconclusive findings on diagnostic imaging of breast: Secondary | ICD-10-CM

## 2017-07-09 ENCOUNTER — Ambulatory Visit
Admission: RE | Admit: 2017-07-09 | Discharge: 2017-07-09 | Disposition: A | Discharge status: Home / Self Care | Source: Ambulatory Visit | Attending: Obstetrics and Gynecology | Admitting: Obstetrics and Gynecology

## 2017-07-09 DIAGNOSIS — R928 Other abnormal and inconclusive findings on diagnostic imaging of breast: Secondary | ICD-10-CM

## 2019-05-08 ENCOUNTER — Ambulatory Visit: Attending: Internal Medicine

## 2019-05-08 DIAGNOSIS — Z20822 Contact with and (suspected) exposure to covid-19: Secondary | ICD-10-CM

## 2019-05-09 LAB — NOVEL CORONAVIRUS, NAA: SARS-CoV-2, NAA: NOT DETECTED

## 2019-09-29 ENCOUNTER — Other Ambulatory Visit: Payer: Self-pay

## 2019-09-29 ENCOUNTER — Ambulatory Visit (INDEPENDENT_AMBULATORY_CARE_PROVIDER_SITE_OTHER): Admitting: Family Medicine

## 2019-09-29 VITALS — BP 110/60 | HR 80 | Temp 97.3°F | Wt 162.0 lb

## 2019-09-29 DIAGNOSIS — R1010 Upper abdominal pain, unspecified: Secondary | ICD-10-CM | POA: Diagnosis not present

## 2019-09-29 MED ORDER — PANTOPRAZOLE SODIUM 40 MG PO TBEC: 40.0000 mg | DELAYED_RELEASE_TABLET | Freq: Every day | ORAL | 1 refills | Status: DC

## 2019-09-29 NOTE — Progress Notes (Signed)
Subjective:    Patient ID: Penny Reid, female    DOB: [birth year], 49 y.o.   MRN: [id number]  HPI Patient is a very pleasant 49 year old Caucasian female who presents today complaining of mid epigastric pain.  The pain has been present for about 3 to 4 weeks.  It goes and comes.  She describes it as a nagging gnawing pain.  It is located below the xiphoid process.  If she eats sourdough bread or drinks a smoothie she does not develop the pain.  However otherwise the food does seem to contribute.  She denies any melena or hematochezia.  She denies any sharp intense right upper quadrant pain.  She denies any alcohol.  She denies any nausea or vomiting or melena or hematochezia.  Occasionally the nagging pain will contribute to some mild nausea.  She denies any bright red blood per rectum.  She denies any vaginal bleeding.  She denies any hematuria or dysuria.  She denies any constipation or fevers or chills or travel Past Surgical History:  Procedure Laterality Date  . AUGMENTATION MAMMAPLASTY Bilateral 2008  . BREAST SURGERY     breast augmentation 11/08  . OTHER SURGICAL HISTORY     removal  of MRSA lesion 4/06   No current outpatient medications on file prior to visit.   No current facility-administered medications on file prior to visit.   No Known Allergies Social History   Socioeconomic History  . Marital status: Married    Spouse name: Not on file  . Number of children: Not on file  . Years of education: Not on file  . Highest education level: Not on file  Occupational History  . Not on file  Tobacco Use  . Smoking status: Former Research scientist (life sciences)  . Smokeless tobacco: Never Used  Substance and Sexual Activity  . Alcohol use: Yes  . Drug use: No  . Sexual activity: Yes    Birth control/protection: None  Other Topics Concern  . Not on file  Social History Narrative  . Not on file   Social Determinants of Health   Financial Resource Strain:   . Difficulty of Paying  Living Expenses:   Food Insecurity:   . Worried About Charity fundraiser in the Last Year:   . Arboriculturist in the Last Year:   Transportation Needs:   . Film/video editor (Medical):   Marland Kitchen Lack of Transportation (Non-Medical):   Physical Activity:   . Days of Exercise per Week:   . Minutes of Exercise per Session:   Stress:   . Feeling of Stress :   Social Connections:   . Frequency of Communication with Friends and Family:   . Frequency of Social Gatherings with Friends and Family:   . Attends Religious Services:   . Active Member of Clubs or Organizations:   . Attends Archivist Meetings:   Marland Kitchen Marital Status:   Intimate Partner Violence:   . Fear of Current or Ex-Partner:   . Emotionally Abused:   Marland Kitchen Physically Abused:   . Sexually Abused:    Patient states that she quit smoking 1 year ago No family history on file. She denies any family history of premature cardiovascular disease, colon cancer, breast cancer    Review of Systems  Gastrointestinal: Positive for abdominal pain.  All other systems reviewed and are negative.      Objective:   Physical Exam Vitals reviewed.  Constitutional:      General: She  is not in acute distress.    Appearance: She is well-developed. She is not diaphoretic.  HENT:     Head: Normocephalic and atraumatic.     Right Ear: External ear normal.     Left Ear: External ear normal.     Nose: Nose normal.     Mouth/Throat:     Pharynx: No oropharyngeal exudate.  Eyes:     General: No scleral icterus.       Right eye: No discharge.        Left eye: No discharge.     Conjunctiva/sclera: Conjunctivae normal.     Pupils: Pupils are equal, round, and reactive to light.  Neck:     Thyroid: No thyromegaly.     Vascular: No JVD.     Trachea: No tracheal deviation.  Cardiovascular:     Rate and Rhythm: Normal rate and regular rhythm.     Heart sounds: Normal heart sounds. No murmur heard.  No friction rub. No gallop.    Pulmonary:     Effort: Pulmonary effort is normal. No respiratory distress.     Breath sounds: Normal breath sounds. No stridor. No wheezing or rales.  Chest:     Chest wall: No tenderness.  Abdominal:     General: Bowel sounds are normal. There is no distension.     Palpations: Abdomen is soft. There is no mass.     Tenderness: There is no abdominal tenderness. There is no guarding or rebound.  Musculoskeletal:        General: No tenderness or deformity.     Cervical back: Normal range of motion and neck supple.  Lymphadenopathy:     Cervical: No cervical adenopathy.  Skin:    General: Skin is warm.     Coloration: Skin is not pale.     Findings: No erythema or rash.  Neurological:     Mental Status: She is alert and oriented to person, place, and time.     Cranial Nerves: No cranial nerve deficit.     Motor: No abnormal muscle tone.     Coordination: Coordination normal.     Deep Tendon Reflexes: Reflexes are normal and symmetric.  Psychiatric:        Behavior: Behavior normal.        Thought Content: Thought content normal.        Judgment: Judgment normal.           Assessment & Plan:  Pain of upper abdomen - Plan: CBC with Differential/Platelet, COMPLETE METABOLIC PANEL WITH GFR, Lipase  I suspect gastritis.  Also on the differential diagnosis would be pancreatitis, biliary dyskinesia, gallstones, IBS.  Treat the patient empirically with Protonix 40 mg a day and then recheck in 10 days.  Check CBC, CMP, lipase.  Consider EGD versus right upper quadrant ultrasound if pain continues.

## 2019-09-30 LAB — CBC WITH DIFFERENTIAL/PLATELET
Absolute Monocytes: 696 cells/uL (ref 200–950)
Basophils Absolute: 26 cells/uL (ref 0–200)
Basophils Relative: 0.3 %
Eosinophils Absolute: 70 cells/uL (ref 15–500)
Eosinophils Relative: 0.8 %
HCT: 41.4 % (ref 35.0–45.0)
Hemoglobin: 14.2 g/dL (ref 11.7–15.5)
Lymphs Abs: 2366 cells/uL (ref 850–3900)
MCH: 30.9 pg (ref 27.0–33.0)
MCHC: 34.3 g/dL (ref 32.0–36.0)
MCV: 90.2 fL (ref 80.0–100.0)
MPV: 10.6 fL (ref 7.5–12.5)
Monocytes Relative: 8 %
Neutro Abs: 5542 cells/uL (ref 1500–7800)
Neutrophils Relative %: 63.7 %
Platelets: 246 10*3/uL (ref 140–400)
RBC: 4.59 10*6/uL (ref 3.80–5.10)
RDW: 12 % (ref 11.0–15.0)
Total Lymphocyte: 27.2 %
WBC: 8.7 10*3/uL (ref 3.8–10.8)

## 2019-09-30 LAB — COMPLETE METABOLIC PANEL WITH GFR
AG Ratio: 2 (calc) (ref 1.0–2.5)
ALT: 11 U/L (ref 6–29)
AST: 16 U/L (ref 10–35)
Albumin: 4.5 g/dL (ref 3.6–5.1)
Alkaline phosphatase (APISO): 38 U/L (ref 37–153)
BUN: 11 mg/dL (ref 7–25)
CO2: 28 mmol/L (ref 20–32)
Calcium: 10.2 mg/dL (ref 8.6–10.4)
Chloride: 103 mmol/L (ref 98–110)
Creat: 0.83 mg/dL (ref 0.50–1.05)
GFR, Est African American: 95 mL/min/{1.73_m2} (ref >=60)
GFR, Est Non African American: 82 mL/min/{1.73_m2} (ref >=60)
Globulin: 2.3 g/dL (calc) (ref 1.9–3.7)
Glucose, Bld: 88 mg/dL (ref 65–99)
Potassium: 4.7 mmol/L (ref 3.5–5.3)
Sodium: 137 mmol/L (ref 135–146)
Total Bilirubin: 0.9 mg/dL (ref 0.2–1.2)
Total Protein: 6.8 g/dL (ref 6.1–8.1)

## 2019-09-30 LAB — LIPASE: Lipase: 24 U/L (ref 7–60)

## 2019-10-02 ENCOUNTER — Other Ambulatory Visit: Payer: Self-pay

## 2019-10-16 ENCOUNTER — Telehealth: Payer: Self-pay

## 2019-10-16 NOTE — Telephone Encounter (Signed)
Pt called and wanted her provider to know that the Pantoprazole that was prescribed is slighty helping and it is some improvement in the pain.

## 2019-10-17 NOTE — Telephone Encounter (Signed)
If not significantly better in 2 weeks, recommend follow up appt.

## 2019-10-17 NOTE — Telephone Encounter (Signed)
Spoke with Pt, she's feeling a little better, she verbalized if she isn't feeling any better in 2 weeks she will follow up.

## 2019-11-16 ENCOUNTER — Encounter: Payer: Self-pay | Admitting: Family Medicine

## 2019-11-16 ENCOUNTER — Other Ambulatory Visit: Payer: Self-pay

## 2019-11-16 ENCOUNTER — Other Ambulatory Visit: Payer: Self-pay | Admitting: Family Medicine

## 2019-11-16 ENCOUNTER — Ambulatory Visit (INDEPENDENT_AMBULATORY_CARE_PROVIDER_SITE_OTHER): Admitting: Family Medicine

## 2019-11-16 VITALS — BP 120/80 | HR 80 | Temp 98.7°F

## 2019-11-16 DIAGNOSIS — R1010 Upper abdominal pain, unspecified: Secondary | ICD-10-CM | POA: Diagnosis not present

## 2019-11-16 DIAGNOSIS — M25561 Pain in right knee: Secondary | ICD-10-CM

## 2019-11-16 MED ORDER — SUCRALFATE 1 G PO TABS: 1.0000 g | ORAL_TABLET | Freq: Three times a day (TID) | ORAL | 0 refills | Status: DC

## 2019-11-16 MED ORDER — PANTOPRAZOLE SODIUM 40 MG PO TBEC: 40.0000 mg | DELAYED_RELEASE_TABLET | Freq: Every day | ORAL | 1 refills | Status: DC

## 2019-11-16 NOTE — Progress Notes (Signed)
Subjective:    Patient ID: Penny Reid, female    DOB: 1970-04-25, 49 y.o.   MRN: [id number]  Abdominal Pain  09/29/19 Patient is a very pleasant 49 year old Caucasian female who presents today complaining of mid epigastric pain.  The pain has been present for about 3 to 4 weeks.  It goes and comes.  She describes it as a nagging gnawing pain.  It is located below the xiphoid process.  If she eats sourdough bread or drinks a smoothie she does not develop the pain.  However otherwise the food does seem to contribute.  She denies any melena or hematochezia.  She denies any sharp intense right upper quadrant pain.  She denies any alcohol.  She denies any nausea or vomiting or melena or hematochezia.  Occasionally the nagging pain will contribute to some mild nausea.  She denies any bright red blood per rectum.  She denies any vaginal bleeding.  She denies any hematuria or dysuria.  She denies any constipation or fevers or chills or travel.  At that time, my plan was: I suspect gastritis.  Also on the differential diagnosis would be pancreatitis, biliary dyskinesia, gallstones, IBS.  Treat the patient empirically with Protonix 40 mg a day and then recheck in 10 days.  Check CBC, CMP, lipase.  Consider EGD versus right upper quadrant ultrasound if pain continues.  11/16/19 Patient states that she took the medication for 4 weeks.  She states that the pain in her mid epigastric area improved 60 to 70%.  However the pain was still present although it was much better.  She denies any hematochezia.  She denies any hematemesis.  She denies any melena.  She denies any fevers or chills or weight loss.  She denies any right upper quadrant pain.  Patient states that for the last month or so, she will develop a sharp pain underneath her kneecap that radiates up the patellar tendon whenever she tries to walk up or down steps.  It also hurts worse underneath her patella when she stands for prolonged period of time or  walks for prolonged period of time.  The pain is not tense however it is present and sharp.  She denies any laxity to varus or valgus stress.  She denies any clicking or locking.  She denies any falls or injuries. Past Surgical History:  Procedure Laterality Date  . AUGMENTATION MAMMAPLASTY Bilateral 2008  . BREAST SURGERY     breast augmentation 11/08  . OTHER SURGICAL HISTORY     removal  of MRSA lesion 4/06   No current outpatient medications on file prior to visit.   No current facility-administered medications on file prior to visit.   No Known Allergies Social History   Socioeconomic History  . Marital status: Married    Spouse name: Not on file  . Number of children: Not on file  . Years of education: Not on file  . Highest education level: Not on file  Occupational History  . Not on file  Tobacco Use  . Smoking status: Former Research scientist (life sciences)  . Smokeless tobacco: Never Used  Substance and Sexual Activity  . Alcohol use: Yes  . Drug use: No  . Sexual activity: Yes    Birth control/protection: None  Other Topics Concern  . Not on file  Social History Narrative  . Not on file   Social Determinants of Health   Financial Resource Strain:   . Difficulty of Paying Living Expenses: Not on file  Food Insecurity:   . Worried About Charity fundraiser in the Last Year: Not on file  . Ran Out of Food in the Last Year: Not on file  Transportation Needs:   . Lack of Transportation (Medical): Not on file  . Lack of Transportation (Non-Medical): Not on file  Physical Activity:   . Days of Exercise per Week: Not on file  . Minutes of Exercise per Session: Not on file  Stress:   . Feeling of Stress : Not on file  Social Connections:   . Frequency of Communication with Friends and Family: Not on file  . Frequency of Social Gatherings with Friends and Family: Not on file  . Attends Religious Services: Not on file  . Active Member of Clubs or Organizations: Not on file  . Attends  Archivist Meetings: Not on file  . Marital Status: Not on file  Intimate Partner Violence:   . Fear of Current or Ex-Partner: Not on file  . Emotionally Abused: Not on file  . Physically Abused: Not on file  . Sexually Abused: Not on file   Patient states that she quit smoking 1 year ago No family history on file. She denies any family history of premature cardiovascular disease, colon cancer, breast cancer    Review of Systems  Gastrointestinal: Positive for abdominal pain.  All other systems reviewed and are negative.      Objective:   Physical Exam Vitals reviewed.  Constitutional:      General: She is not in acute distress.    Appearance: She is well-developed. She is not diaphoretic.  HENT:     Head: Normocephalic and atraumatic.  Neck:     Thyroid: No thyromegaly.     Vascular: No JVD.     Trachea: No tracheal deviation.  Cardiovascular:     Rate and Rhythm: Normal rate and regular rhythm.     Heart sounds: Normal heart sounds.  Pulmonary:     Effort: Pulmonary effort is normal. No respiratory distress.     Breath sounds: Normal breath sounds. No wheezing.  Chest:     Chest wall: No tenderness.  Abdominal:     General: Bowel sounds are normal. There is no distension.     Palpations: Abdomen is soft.     Tenderness: There is no abdominal tenderness. There is no guarding.  Musculoskeletal:     Right knee: No effusion, erythema or bony tenderness. Normal range of motion. No tenderness. No LCL laxity, MCL laxity or ACL laxity. Normal alignment and normal meniscus.  Skin:    General: Skin is warm.  Neurological:     Mental Status: She is alert and oriented to person, place, and time.     Cranial Nerves: No cranial nerve deficit.     Motor: No abnormal muscle tone.     Coordination: Coordination normal.     Deep Tendon Reflexes: Reflexes are normal and symmetric.  Psychiatric:        Behavior: Behavior normal.        Thought Content: Thought content  normal.        Judgment: Judgment normal.           Assessment & Plan:  Pain of upper abdomen  Acute pain of right knee  Given the significant improvement of her pain on Protonix, I suspect that the patient has gastritis.  I believe that she needs to complete a 6 to 8-week course of PPI therapy in order for the  pain to totally resolve.  That is why I believe the pain is still persistent although significantly better.  Therefore I recommend that she continue Protonix 40 mg a day for an additional month and supplement with sucralfate 1 g p.o. q. [place] for 1 month.  If the pain persist at that point, I would recommend GI consultation for an EGD.  I believe the patient is either experiencing patellofemoral syndrome in the right knee or may have some slight arthritis on the undersurface of the sella.  I want to avoid NSAIDs given the situation with her stomach.  We discussed a cortisone injection in the knee and the patient decides against that today.  Therefore she will try Voltaren gel 2 g 4 times daily as needed for knee pain and then reassess in 1 month

## 2020-08-15 ENCOUNTER — Other Ambulatory Visit: Payer: Self-pay

## 2020-08-15 ENCOUNTER — Ambulatory Visit (INDEPENDENT_AMBULATORY_CARE_PROVIDER_SITE_OTHER): Admitting: Nurse Practitioner

## 2020-08-15 ENCOUNTER — Encounter: Payer: Self-pay | Admitting: Nurse Practitioner

## 2020-08-15 VITALS — BP 140/82 | HR 104 | Temp 98.6°F | Wt 172.2 lb

## 2020-08-15 DIAGNOSIS — R21 Rash and other nonspecific skin eruption: Secondary | ICD-10-CM | POA: Diagnosis not present

## 2020-08-15 DIAGNOSIS — W57XXXA Bitten or stung by nonvenomous insect and other nonvenomous arthropods, initial encounter: Secondary | ICD-10-CM

## 2020-08-15 DIAGNOSIS — S90464A Insect bite (nonvenomous), right lesser toe(s), initial encounter: Secondary | ICD-10-CM | POA: Diagnosis not present

## 2020-08-15 MED ORDER — DOXYCYCLINE HYCLATE 100 MG PO TABS: 100.0000 mg | ORAL_TABLET | Freq: Two times a day (BID) | ORAL | 0 refills | Status: DC

## 2020-08-15 MED ORDER — CLOTRIMAZOLE 1 % EX OINT: 1.0000 "application " | TOPICAL_OINTMENT | Freq: Two times a day (BID) | CUTANEOUS | 0 refills | Status: DC

## 2020-08-15 NOTE — Progress Notes (Signed)
Subjective:    Patient ID: Penny Reid, female    DOB: 21-Oct-1970, 50 y.o.   MRN: [id number]  HPI: Penny Reid is a 50 y.o. female presenting for tick bite.  Chief Complaint  Patient presents with  . Rash    Has tick bites on left arm, bottom of stomach and toe on right foot  Will get pap for nx visit   TICK BITE Duration: days Location: right foot in between first and second toe History of trauma in area: yes; tick bite Pain: no Quality: itching  Severity: no pain Redness: yes Swelling: no Oozing: no Pus: no Fevers: no Nausea/vomiting: no  Headache: no Vision changes: no Arthralgias: no Status: stable Treatments attempted:none   RASH Patient reports she and her family recently adopted 2 cats from the rescue; once they got home, the cats started losing their fur in patches and were diagnosed with a fungal infection.  Now, the patient has 3 lesions on her body; left upper arm and pubic area, that are circular and red with flaking.  The lesions are itchy. Duration:  days  Location: left arm, pubic area  Itching: yes Burning: no Redness: yes Oozing: no Scaling: yes Blisters: no Painful: no Fevers: no Change in detergents/soaps/personal care products: no Recent illness: no Recent travel:no History of same: no Context: worse Alleviating factors: nothing tried Treatments attempted: nothing tried Shortness of breath: no  Throat/tongue swelling: no Myalgias/arthralgias: no  No Known Allergies  Outpatient Encounter Medications as of 08/15/2020  Medication Sig  . Clotrimazole 1 % OINT Apply 1 application topically in the morning and at bedtime. Apply twice daily for up to 4 weeks.  Let your prescriber know with no improvement.  Marland Kitchen doxycycline (VIBRA-TABS) 100 MG tablet Take 1 tablet (100 mg total) by mouth 2 (two) times daily.  . [DISCONTINUED] COVID-19 mRNA vaccine, Pfizer, (PFIZER-BIONTECH COVID-19 VACC) 30 MCG/0.3ML injection   . [DISCONTINUED]  COVID-19 mRNA vaccine, Pfizer, 30 MCG/0.3ML injection   . [DISCONTINUED] pantoprazole (PROTONIX) 40 MG tablet Take 1 tablet (40 mg total) by mouth daily.  . [DISCONTINUED] sucralfate (CARAFATE) 1 g tablet Take 1 tablet (1 g total) by mouth 4 (four) times daily -  with meals and at bedtime.   No facility-administered encounter medications on file as of 08/15/2020.    There are no problems to display for this patient.   Past Medical History:  Diagnosis Date  . Tobacco consumption    quit 2018    Relevant past medical, surgical, family and social history reviewed and updated as indicated. Interim medical history since our last visit reviewed.  Review of Systems Per HPI unless specifically indicated above     Objective:    BP 140/82   Pulse (!) 104   Temp 98.6 F (37 C)   Wt 172 lb 3.2 oz (78.1 kg)   SpO2 95%   BMI 27.79 kg/m   Wt Readings from Last 3 Encounters:  08/15/20 172 lb 3.2 oz (78.1 kg)  09/29/19 162 lb (73.5 kg)  05/21/17 154 lb (69.9 kg)    Physical Exam Vitals and nursing note reviewed.  Constitutional:      General: She is not in acute distress.    Appearance: Normal appearance. She is not toxic-appearing.  Eyes:     General: No scleral icterus.    Extraocular Movements: Extraocular movements intact.  Skin:    General: Skin is warm and dry.     Coloration: Skin is not pale.  Findings: Lesion and rash present. No erythema. Rash is papular and scaling.          Comments: 5 mm x 10 mm erythematous lesion noted to left arm; 15 mm x 10 mm lesion to pubic area as indicated above.  Lesions erythematous with central clearing and flaking.  They are intensely pruritic.   Neurological:     Mental Status: She is alert and oriented to person, place, and time.  Psychiatric:        Mood and Affect: Mood normal.        Thought Content: Thought content normal.        Judgment: Judgment normal.       Assessment & Plan:  1. Rash Acute.  Examination consistent  with tinea.  Will treat with topical antifungal twice daily.  Return to clinic with no improvement.  - Clotrimazole 1 % OINT; Apply 1 application topically in the morning and at bedtime. Apply twice daily for up to 4 weeks.  Let your prescriber know with no improvement.  Dispense: 56.7 g; Refill: 0  2. Tick bite of lesser toe of right foot, initial encounter Acute.  No s/s Lyme disease or rocky mountain spotted fever.  Discussed s/s to watch for and prescription given for doxycycline should these symptoms start.   - doxycycline (VIBRA-TABS) 100 MG tablet; Take 1 tablet (100 mg total) by mouth 2 (two) times daily.  Dispense: 14 tablet; Refill: 0      Follow up plan: Return if symptoms worsen or fail to improve.

## 2020-10-14 ENCOUNTER — Encounter: Payer: Self-pay | Admitting: Nurse Practitioner

## 2020-10-14 ENCOUNTER — Ambulatory Visit (INDEPENDENT_AMBULATORY_CARE_PROVIDER_SITE_OTHER): Admitting: Nurse Practitioner

## 2020-10-14 ENCOUNTER — Other Ambulatory Visit: Payer: Self-pay

## 2020-10-14 VITALS — BP 110/84 | HR 63 | Temp 98.5°F | Ht 66.0 in | Wt 170.0 lb

## 2020-10-14 DIAGNOSIS — R197 Diarrhea, unspecified: Secondary | ICD-10-CM

## 2020-10-14 DIAGNOSIS — R112 Nausea with vomiting, unspecified: Secondary | ICD-10-CM

## 2020-10-14 DIAGNOSIS — R42 Dizziness and giddiness: Secondary | ICD-10-CM | POA: Diagnosis not present

## 2020-10-14 MED ORDER — MECLIZINE HCL 25 MG PO TABS
25.0000 mg | ORAL_TABLET | Freq: Three times a day (TID) | ORAL | 0 refills | Status: AC | PRN
Start: 2020-10-14 — End: ?

## 2020-10-14 NOTE — Progress Notes (Signed)
Subjective:    Patient ID: Penny Reid, female    DOB: 07/25/1970, 50 y.o.   MRN: [id number]  HPI: Penny Reid is a 50 y.o. female presenting for dizziness, nausea, and vomiting.  Chief Complaint  Patient presents with   Dizziness    Since yesterday with vomiting    GASTROENTERITIS Symptoms started yesterday.  Patient reports she got out of bed to get coffee yesterday morning and when she returned to bed, she got extremely dizzy.  The room started spinning.  She has had numerous episodes of vomiting and diarrhea over the past 24 hours and has not had much of an appetite.  She has been able to keep sips of water down.  Duration: days Diarrhea: yes ; nonbloody  Episodes of diarrhea/day: 4 Description of diarrhea: yellow, some solids Nausea: yes Vomiting: yes Episodes of vomit/day: 5-6 times Description of vomiting: clear, sometimes bile Abdominal pain: yes Fever: yes Decreased appetite: no Loss of taste or smell: no Tolerating liquids: yes Foreign travel: no Relevant dietary history: ate at [employer] last week  Similar illness in contacts: no Recent antibiotic use: no Status: Stable Treatments attempted: Sips of water  No Known Allergies  Outpatient Encounter Medications as of 10/14/2020  Medication Sig   meclizine (ANTIVERT) 25 MG tablet Take 1 tablet (25 mg total) by mouth 3 (three) times daily as needed for dizziness.   [DISCONTINUED] Clotrimazole 1 % OINT Apply 1 application topically in the morning and at bedtime. Apply twice daily for up to 4 weeks.  Let your prescriber know with no improvement.   [DISCONTINUED] doxycycline (VIBRA-TABS) 100 MG tablet Take 1 tablet (100 mg total) by mouth 2 (two) times daily.   No facility-administered encounter medications on file as of 10/14/2020.    There are no problems to display for this patient.   Past Medical History:  Diagnosis Date   Tobacco consumption    quit 2018    Relevant past medical, surgical,  family and social history reviewed and updated as indicated. Interim medical history since our last visit reviewed.  Review of Systems Per HPI unless specifically indicated above     Objective:    BP 110/84   Pulse 63   Temp 98.5 F (36.9 C)   Ht '5\' 6"'$  (1.676 m)   Wt 170 lb (77.1 kg)   SpO2 97%   BMI 27.44 kg/m   Wt Readings from Last 3 Encounters:  10/14/20 170 lb (77.1 kg)  08/15/20 172 lb 3.2 oz (78.1 kg)  09/29/19 162 lb (73.5 kg)    Physical Exam Vitals and nursing note reviewed.  Constitutional:      General: She is not in acute distress.    Appearance: Normal appearance. She is not toxic-appearing.  HENT:     Head: Normocephalic and atraumatic.     Right Ear: Tympanic membrane, ear canal and external ear normal.     Left Ear: Tympanic membrane, ear canal and external ear normal.     Nose: Nose normal. No congestion.  Eyes:     General: No scleral icterus.    Extraocular Movements: Extraocular movements intact.     Pupils: Pupils are equal, round, and reactive to light.  Cardiovascular:     Rate and Rhythm: Normal rate and regular rhythm.     Heart sounds: Normal heart sounds. No murmur heard. Pulmonary:     Effort: Pulmonary effort is normal. No respiratory distress.     Breath sounds: Normal breath sounds.  No wheezing.  Abdominal:     General: Abdomen is flat. Bowel sounds are normal. There is no distension.     Palpations: Abdomen is soft. There is no mass.     Tenderness: There is no abdominal tenderness. There is no right CVA tenderness, left CVA tenderness or guarding.  Musculoskeletal:        General: Normal range of motion.     Cervical back: Normal range of motion.     Right lower leg: No edema.     Left lower leg: No edema.  Lymphadenopathy:     Cervical: No cervical adenopathy.  Skin:    General: Skin is warm and dry.     Capillary Refill: Capillary refill takes less than 2 seconds.     Coloration: Skin is not jaundiced or pale.  Neurological:      Mental Status: She is alert and oriented to person, place, and time.  Psychiatric:        Mood and Affect: Mood normal.        Behavior: Behavior normal.        Thought Content: Thought content normal.        Judgment: Judgment normal.        Assessment & Plan:  1. Dizziness Acute.  With history of vertigo years ago that presented similarly, will start trial of meclizine to see if this helps with dizziness.  Otherwise, signs and symptoms point to viral gastroenteritis with diarrhea and vomiting.  No red flags today-no neurologic changes, blood pressure stable, heart rate is normal.  Likely, symptoms will self resolve over the next 2 days or so.  Return to clinic if symptoms persist more than 5 days or if they worsen. With any sudden onset new chest pain, sweating, or shortness of breath, go to ED.   - meclizine (ANTIVERT) 25 MG tablet; Take 1 tablet (25 mg total) by mouth 3 (three) times daily as needed for dizziness.  Dispense: 30 tablet; Refill: 0  2. Diarrhea, unspecified type   3. Intractable vomiting with nausea, unspecified vomiting type     Follow up plan: Return if symptoms worsen or fail to improve.

## 2021-05-26 ENCOUNTER — Other Ambulatory Visit: Payer: Self-pay

## 2021-05-26 ENCOUNTER — Ambulatory Visit (INDEPENDENT_AMBULATORY_CARE_PROVIDER_SITE_OTHER): Admitting: Psychiatry

## 2021-05-26 DIAGNOSIS — F331 Major depressive disorder, recurrent, moderate: Secondary | ICD-10-CM | POA: Diagnosis not present

## 2021-05-26 NOTE — Progress Notes (Signed)
[facility] Counselor Initial Adult Exam  Name: Penny Reid Date: 05/26/2021 MRN: [id number] DOB: [birth year] PCP: Susy Frizzle, MD  Time spent: 60 minutes  Guardian/Payee:  patient    Paperwork requested:  No   Reason for Visit /Presenting Problem: depression, "issue I'm having as a mother", added "was last in therapy  10-11 yrs ago ", dealing with a lot of expectations of herself after defining herself as "a bad mom"  Mental Status Exam:    Appearance:   Casual     Behavior:  Appropriate, Sharing, and Motivated  Motor:  Normal  Speech/Language:   Clear and Coherent  Affect:  Depressed  Mood:  depressed  Thought process:  goal directed  Thought content:    overthinking  Sensory/Perceptual disturbances:    WNL  Orientation:  oriented to person, place, time/date, situation, day of week, month of year, year, and stated date of May 26, 2021  Attention:  Fair  Concentration:  Good  Memory:  [place] of knowledge:   Good  Insight:    Good  Judgment:   Good  Impulse Control:  Good   Reported Symptoms:  see symptoms above  Risk Assessment: Danger to Self:  No Self-injurious Behavior: No Danger to Others: No Duty to Warn:no Physical Aggression / Violence:No  Access to Firearms a concern: No  Gang Involvement: No Patient / guardian was educated about steps to take if suicide or homicide risk level increases between visits: Denies any SI or HI. While future psychiatric events cannot be accurately predicted, the patient does not currently require acute inpatient psychiatric care and does not currently meet [facility] involuntary commitment criteria.  Substance Abuse History: Current substance abuse: some vaping, "I switched from smoking to vaping."    Past Psychiatric History:   Previous psychological history is significant for anger management 10-11 yrs ago.  "I've gotten better in controlling my reactions. Outpatient Providers:[facility] History of Psych  Hospitalization: No  Psychological Testing:  n/a    Abuse History: Victim of No.,  n/a    Report needed: No. Victim of Neglect:No. Perpetrator of  n/a   Witness / Exposure to Domestic Violence: No   Protective Services Involvement: No  Witness to [employer] Violence:  No   Family History: No family history on file.  Living situation: the patient lives with their family, and 2 of my kids (ages 48 and 86). Other adult child lives in [place]. Issues with my kids, I don't feel I've been a good mom. Feels main problem is her ability to deal with kids' mental health issues. 12 yr old(adhd, dmdd, anxiety) and  52 yr old (depression, dmdd, anxiety, self-harming when younger but now).   Sexual Orientation:  Straight  Relationship Status: married; Name of spouse / other: married for 22 years.  Husband is somewhat supportive and other times I feel he's disconnected. Employed full time with [employer] in [place] and commutes daily. If a parent, number of children / ages:3 kids, ages 61, 65, and 68 who lives in [place] but is on contact with family here. Parents are living, dad is supportive, mom is not due to her own "undiagnosed mental health issues."  Support Systems; spouse friends dad  Financial Stress:  No   Income/Employment/Disability: Employment  Armed forces logistics/support/administrative officer: No , but husband is.  Educational History: Education: Scientist, product/process development:   [employer]  Any cultural differences that may affect / interfere with treatment:  not applicable   Recreation/Hobbies: gardening,  being outside   Stressors:Marital or family conflict    Strengths:  Supportive Relationships, Family, Friends, Hopefulness, and Able to Communicate Effectively  Barriers:  "I don't see any barriers."  Legal History: Pending legal issue / charges: The patient has no significant history of legal issues. History of legal issue / charges:  n/a  Medical History/Surgical  History: Reviewed with patient and she confirms info below:  Past Surgical History:  Procedure Laterality Date   AUGMENTATION MAMMAPLASTY Bilateral 2008   BREAST SURGERY     breast augmentation 11/08   OTHER SURGICAL HISTORY     removal  of MRSA lesion 4/06    Medications: Current Outpatient Medications  Medication Sig Dispense Refill   meclizine (ANTIVERT) 25 MG tablet Take 1 tablet (25 mg total) by mouth 3 (three) times daily as needed for dizziness. 30 tablet 0   No current facility-administered medications for this visit.    No Known Allergies  No known allergies as of 05/26/2021.  Diagnoses:    ICD-10-CM   1. Depression, major, recurrent, moderate ([place])  F33.1      Treatment goal plan: Patient not signing treatment plan on computer screen due to [place].  Treatment goals: Treatment goals remain on treatment plan as patient works with strategies to achieve her goals.  Progress is assessed each session and documented in "subject" and/or "plan" section of treatment note.  Long-term goal: Elevate mood and show evidence of usual energy, activities, and socialization level.  Short-term goal: Make positive statements regarding self and her ability to cope with life stressors.  Strategies: Reinforced positive, reality based cognitive messages that enhance self-confidence and increase adaptive action.   Plan of Care:  This is patient's first visit with this therapist and today we completed her initial evaluation for therapy in addition to her initial treatment goal plan.  She is a 51 year old, married 52 yrs, mother of 3 children ages: 1, 69, and 52.  Husband works full-time with [employer] in [place] and commutes there daily.  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  The 22 yr old adult child lives in [place] and other 2 live at home.  Reports that the 2 children living at home have issues and part of patient's concern is trying to help them with their mental health  concerns. Both kids are in therapy and on meds. Daughter (52) is inconsistent in her mood and behaviors and diagnosed DMDD, anxiety, depression, and some self harming when she was younger but not now.. Son, age 24, is diagnosed with adhd and DMDD and also exhibits anxiety. Patient reporting challenging issues in parenting her children especially with their mental health concerns and dealing with her own issues.  She describes her main concerns as being her depression, "the issues I am having as a mother", and dealing with a lot of expectations she has of herself after defining herself as a "bad mom".  Her feelings that are feeding that definition are mainly surrounding her lack of patience with her kids growing up and at that time had not been diagnosed with mental health concerns so she tended to interpret their behavior as just acting out.  Since then they both have been diagnosed and are in treatment and are doing some better, and patient herself seems to be doing some better but does remember former days when she did not cope very well with her children and felt some of that was mistreatment although not intentional.  Patient currently denies any  substance abuse although stated "I switched from smoking to vaping."  She reports being last seen approximately 10 to 11 years ago and that was primarily for anger management.  She denies any history of abuse for herself.  She reports that her parents are living and that dad is supportive but mom is not due to her own "undiagnosed mental health issues".  Her support system consists of her spouse, dad, and a few friends but states that she does not talk very deeply with her friends about personal issues.  Denies any abuse history of her own.  Patient does report she gets pleasure out of gardening and being outside the house.  She reports her stressors as being "marital and family conflict".  She sees her strengths as being her husband and dad, supportive relationships  although they do not know intimate family details, a sense of hopefulness and able to communicate effectively.  Patient explains that she wants to be calmer, a more loving mother, feel more engaged with my kids, "and be better able to cope with the reality of my children's mental health situations."  "I do not like being a mom as I do not like the responsibility".  Reports that husband is not very helpful and she is not sure if he is open to try to be more helpful as he works more hours and tends to be disconnected from the kids which bothers patient.  Patient adds that when she talks about this with her husband, no change occurs.  "He really does not talk about the kids much and does not follow through in our conversations about them".  Patient later states that she feels her marriage is healthy "as we enjoy each other's company, have common interests, and do things together."  She states in reference to her children that "I should have gotten them help sooner", and regrets getting into arguments and physical battles in the past with her daughter before her daughter was diagnosed.  Today, patient feeling overwhelmed with circumstances right now and has had depressive episodes before and is experiencing them recurrently now.  Reports having supportive friends and yet also says she does not share a whole lot of family information with them.  Discussed treatment goals with patient and she is in agreement and seems motivated for treatment.  From information she shared today it does seem that some self-forgiveness needs to occur for her also in order to move forward feeling better about herself and her care for her children.  Goal review of initial treatment goals and patient is in agreement.  Next appointment within 2 weeks.  This record has been created using [employer].  Chart creation errors have been sought, but may not always have been located and corrected.  Such creation errors do not reflect on the  standard of medical care provided.   Shanon Ace, LCSW

## 2021-06-19 ENCOUNTER — Ambulatory Visit (INDEPENDENT_AMBULATORY_CARE_PROVIDER_SITE_OTHER): Admitting: Psychiatry

## 2021-06-19 DIAGNOSIS — F331 Major depressive disorder, recurrent, moderate: Secondary | ICD-10-CM

## 2021-06-19 NOTE — Progress Notes (Signed)
?    [facility] Counselor/Therapist Progress Note ? ?Patient ID: Penny Reid, MRN: [id number],   ? ?Date: 06/19/2021 ? ?Time Spent: 55 minutes  ? ?Treatment Type: Individual Therapy ? ?Reported Symptoms: anxiety, stressed, defining myself as a "bad mom" ? ?Mental Status Exam: ? ?Appearance:   Casual     ?Behavior:  Appropriate, Sharing, and Motivated  ?Motor:  Normal  ?Speech/Language:   Clear and Coherent  ?Affect:  Depressed and anxious  ?Mood:  anxious and depressed  ?Thought process:  goal directed  ?Thought content:    overthinking  ?Sensory/Perceptual disturbances:    WNL  ?Orientation:  oriented to person, place, time/date, situation, day of week, month of year, year, and stated date of June 19, 2021  ?Attention:  Good  ?Concentration:  Good  ?Memory:  WNL  ?Fund of knowledge:   Good  ?Insight:    Good  ?Judgment:   Good  ?Impulse Control:  Good and Fair  ? ?Risk Assessment: ?Danger to Self:  No ?Self-injurious Behavior: No ?Danger to Others: No ?Duty to Warn:no ?Physical Aggression / Violence:No  ?Access to Firearms a concern: No  ?Gang Involvement:No  ? ?Subjective: Patient today reports lots of anxiety and stress, travelling out of state to oldest son's wedding. Processed a lot of her concerns re: the marriage, and also re: family and 2 younger children. Shared update list of rank-ordered goals for therapy: ?(1) Learn to set appropriate boundaries especially with daughter,and help them to be independent in age-appropriate ways, while still being supportive of them. ?(2) For me to learn to not react and not overwhelmed to my daughter's and my mom's words. They push my emotional buttons, and not respond emotionally. Wants more inner control. ?(3) Learn to interact and relate with daughter without getting caught up in her emotional upheaval.. ?(4) Work on motivating my son to do school work independently as appropriate and help him understand the importance and value of doing his assigned work. ?(5)  Find a way to engage in my hobbies without feeling guilty or worrying about the kids fighting.  ?Discussed #1 above and what patient is currently doing to help her kids  behaviorally and also the triggers from her past that are easily activated due to her kids' behavior. Both kids are in treatment with counselors. Arguments take her back to her history with her own mother and ends up feeling she is failing as a mom.Wanting to be able to have conversations with daughter with out conflict and being able to really listen. Emphasized self-care (with specific examples), being more proactive, needs to have healthier limits. ? ?Interventions: Solution-Oriented/Positive Psychology and Insight-Oriented ? ?Treatment goal plan: ?Patient not signing treatment plan on computer screen due to [place]. ?Treatment goals: ?Treatment goals remain on treatment plan as patient works with strategies to achieve her goals.  Progress is assessed each session and documented in "subject" and/or "plan" section of treatment note. ?Long-term goal: ?Elevate mood and show evidence of usual energy, activities, and socialization level. ?Short-term goal: ?Make positive statements regarding self and her ability to cope with life stressors. ?Strategies: ?Reinforced positive, reality based cognitive messages that enhance self-confidence and increase adaptive action ? ?Diagnosis: ?  ICD-10-CM   ?1. Depression, major, recurrent, moderate (HCC)  F33.1   ?  ? ?Plan: Patient today showing good motivation and active participation in session as she worked further on some family issues initially and then later on her communication with her daughter who has significant behavioral health issues.  Encouraging patient not to define herself as a "bad mom".  She came in today with several examples of situations that she wanted assistance with especially in regards to the communication piece, "as it seems everything I say is never the right thing for her daughter".   Work with some of these specific examples not only on communication but also the important piece of being quiet and using some good active listening skills.  This seemed to be helpful for patient and she is to practice some of what we worked on in session today and we will pick up again next session.  Very motivated parent and trying not to "over-relate "what is going on with her daughter to her on past history with significant conflict and emotional health issues. ? ?Goal review and progress/challenges noted with patient. ? ?Next appointment within 3 weeks. ? ?This record has been created using [employer].  Chart creation errors have been sought, but may not always have been located and corrected.  Such creation errors do not reflect on the standard of medical care provided. ? ? ?Shanon Ace, LCSW ? ? ? ? ? ? ? ? ? ? ? ? ? ? ? ? ? ? ?

## 2021-06-19 NOTE — Progress Notes (Deleted)
?      [facility] Counselor/Therapist Progress Note ? ?Patient ID: Penny Reid, MRN: [id number],   ? ?Date: 06/19/2021 ? ?Time Spent: ***  ? ?Treatment Type: {[facility] AMB THERAPY TYPES:[id number]} ? ?Reported Symptoms: *** ? ?Mental Status Exam: ? ?Appearance:   {PSY:22683}     ?Behavior:  {PSY:21022743}  ?Motor:  {PSY:22302}  ?Speech/Language:   {PSY:22685}  ?Affect:  {PSY:22687}  ?Mood:  {PSY:31886}  ?Thought process:  {PSY:31888}  ?Thought content:    {[phone]}  ?Sensory/Perceptual disturbances:    {PSY:201-033-8289}  ?Orientation:  {PSY:30297}  ?Attention:  {PSY:22877}  ?Concentration:  {PSY:419-250-6982}  ?Memory:  {PSY:430-649-5276}  ?Fund of knowledge:   {PSY:419-250-6982}  ?Insight:    {PSY:419-250-6982}  ?Judgment:   {PSY:419-250-6982}  ?Impulse Control:  {PSY:419-250-6982}  ? ?Risk Assessment: ?Danger to Self:  {PSY:22692} ?Self-injurious Behavior: {PSY:22692} ?Danger to Others: {PSY:22692} ?Duty to Warn:{PSY:311194} ?Physical Aggression / Violence:{PSY:21197} ?Access to Firearms a concern: {PSY:21197} ?Gang Involvement:{PSY:21197} ? ?Subjective: ***  ? ?Interventions: {PSY:970-332-4524} ? ?Diagnosis:No diagnosis found. ? ?Plan: *** ? ?Shanon Ace, LCSW ? ? ? ? ? ? ? ? ? ? ? ? ? ? ? ? ? ? ?

## 2021-07-01 ENCOUNTER — Ambulatory Visit (INDEPENDENT_AMBULATORY_CARE_PROVIDER_SITE_OTHER): Admitting: Psychiatry

## 2021-07-01 DIAGNOSIS — F331 Major depressive disorder, recurrent, moderate: Secondary | ICD-10-CM
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
## 2021-07-15 ENCOUNTER — Ambulatory Visit (INDEPENDENT_AMBULATORY_CARE_PROVIDER_SITE_OTHER): Admitting: Psychiatry

## 2021-07-15 DIAGNOSIS — F331 Major depressive disorder, recurrent, moderate: Secondary | ICD-10-CM | POA: Diagnosis not present

## 2021-07-15 NOTE — Progress Notes (Signed)
?    [facility] Counselor/Therapist Progress Note ? ?Patient ID: Penny Reid, MRN: [id number],   ? ?Date: 07/15/2021 ? ?Time Spent: 55 minutes  ? ?Treatment Type: Individual Therapy ? ?Reported Symptoms: anxiety, no depression ? ?Mental Status Exam: ? ?Appearance:   Casual     ?Behavior:  Appropriate, Sharing, and Motivated  ?Motor:  Normal  ?Speech/Language:   Clear and Coherent  ?Affect:  anxious  ?Mood:  anxious  ?Thought process:  goal directed  ?Thought content:    overthinking  ?Sensory/Perceptual disturbances:    WNL  ?Orientation:  oriented to person, place, time/date, situation, day of week, month of year, year, and stated date of July 15, 2021  ?Attention:  Fair  ?Concentration:  Good  ?Memory:  WNL  ?Fund of knowledge:   Good  ?Insight:    Good  ?Judgment:   Good  ?Impulse Control:  Good  ? ?Risk Assessment: ?Danger to Self:  No ?Self-injurious Behavior: No ?Danger to Others: No ?Duty to Warn:no ?Physical Aggression / Violence:No  ?Access to Firearms a concern: No  ?Gang Involvement:No  ? ?Subjective:  Patient today reporting anxiety "but no depression which is good."  "Feeling overwhelmed with parenting, house issues, paying bills, homeschooling, issues with kids". Can feel paralyzed at times and like I dont' get much done." Kids responsible for cleaning their bathroom and their bedrooms and do their own laundry. Issues with insurance company about 60 yr old daughter's dental appt which has been very stressful.  Patient today very "worn out advocating all the time and not getting very far sometimes."  Both kids have been in therapy and patient trying to be attuned to their needs. Daughter's issues more pervasive. Patient coping with controlling mom. Overwhelmed with home responsibilities and not a lot of supportive help. Worked today on not giving a lot of attention when the kids are being disrespectful "I need to not always respond as that often makes it worse." Also needing to pull back some  and focus on some positive self-care which she feels is lacking. Working to reduce guilt. Trying to encourage husband to decrease drinking who doesn't feel he has a problem. Past triggers a problem. Some resentment re: husband not helping out as much at home. Boundaries, positive self-care, and healthier limits needed.  ? ?Interventions: Solution-Oriented/Positive Psychology, Ego-Supportive, and Insight-Oriented ? ?Treatment goal plan: ?Patient not signing treatment plan on computer screen due to [place]. ?Treatment goals: ?Treatment goals remain on treatment plan as patient works with strategies to achieve her goals.  Progress is assessed each session and documented in "subject" and/or "plan" section of treatment note. ?Long-term goal: ?Elevate mood and show evidence of usual energy, activities, and socialization level. ?Short-term goal: ?Make positive statements regarding self and her ability to cope with life stressors. ?Strategies: ?Reinforced positive, reality based cognitive messages that enhance self-confidence and increase adaptive action ? ?Diagnosis: ?  ICD-10-CM   ?1. Depression, major, recurrent, moderate (HCC)  F33.1   ?  ? ?Plan:  Patient in today with good motivation and active participation in session as she focused more on her parenting and family issues, some resentment, and feeling overstressed and needing healthier boundaries and limits.  Worked on this in session as noted above and emphasize patient's need for better self-care and setting of limits for herself and healthier boundaries with others.  Emphasize this because of all the situations that she deals with on a daily basis with the needs of her 2 older teenagers especially, as well  as being in relationship with mother that can be very challenging.  Patient to follow up on some suggestions from session today in combination with her goal-directed behaviors especially aimed at having some time just to herself and the need to set more effective  boundaries with other family members.  Motivation seemed better today. Encouraged patient in the practice of more consistent positive behaviors including: Noticing the positives as quickly as she notices negatives, not defining herself as a "bad mom", staying in touch with people who are supportive, get outside daily, healthy nutrition and exercise, not assuming worst case scenarios, practicing positive self talk, reduce overthinking and over analyzing, challenge and counteract her self doubt, interrupt negative/anxious thoughts and challenge them to replace with more reality-based thoughts, saying no without feeling guilty, letting go of guilt from the past that hold her back as discussed in sessions, continue her work on control and perfectionistic issues, reflect on her progress more frequently, and realize the strength that she shows working with goal-directed behaviors to move in a direction that supports her improved emotional health and increased self-confidence ? ?Goal review and progress/challenges noted with patient. ? ?Next appointment within 2 to 3 weeks. ? ?This record has been created using [employer].  Chart creation errors have been sought, but may not always have been located and corrected.  Such creation errors do not reflect on the standard of medical care provided. ? ? ?Shanon Ace, LCSW ? ? ? ? ? ? ? ? ? ? ? ? ? ? ? ? ? ? ?

## 2021-07-24 ENCOUNTER — Ambulatory Visit (INDEPENDENT_AMBULATORY_CARE_PROVIDER_SITE_OTHER): Admitting: Family Medicine

## 2021-07-24 VITALS — BP 114/76 | HR 77 | Temp 97.7°F | Ht 66.0 in | Wt 166.6 lb

## 2021-07-24 DIAGNOSIS — Z0000 Encounter for general adult medical examination without abnormal findings: Secondary | ICD-10-CM

## 2021-07-24 DIAGNOSIS — Z13220 Encounter for screening for lipoid disorders: Secondary | ICD-10-CM

## 2021-07-24 DIAGNOSIS — Z1231 Encounter for screening mammogram for malignant neoplasm of breast: Secondary | ICD-10-CM | POA: Diagnosis not present

## 2021-07-24 DIAGNOSIS — Z124 Encounter for screening for malignant neoplasm of cervix: Secondary | ICD-10-CM

## 2021-07-24 DIAGNOSIS — Z1211 Encounter for screening for malignant neoplasm of colon: Secondary | ICD-10-CM

## 2021-07-24 NOTE — Progress Notes (Signed)
? ?Subjective:  ? ? Patient ID: Penny Reid, female    DOB: 12-Mar-1971, 51 y.o.   MRN: [id number] ?Patient is a very pleasant 51 year old Caucasian female here today for complete physical exam.  She denies any concerns.  She is overdue for mammogram.  Her last mammogram was about 4 years ago.  She is also due for a Pap smear.  Her last Pap smear was 4 years ago.  She is never had a colonoscopy.  Her tetanus shot is up-to-date.  She is due for a COVID booster.  We discussed this and I believe she is very low risk.  She is also due for the shingles vaccine. ?Past Surgical History:  ?Procedure Laterality Date  ? AUGMENTATION MAMMAPLASTY Bilateral 2008  ? BREAST SURGERY    ? breast augmentation 11/08  ? OTHER SURGICAL HISTORY    ? removal  of MRSA lesion 4/06  ? ?Current Outpatient Medications on File Prior to Visit  ?Medication Sig Dispense Refill  ? meclizine (ANTIVERT) 25 MG tablet Take 1 tablet (25 mg total) by mouth 3 (three) times daily as needed for dizziness. (Patient not taking: Reported on 07/24/2021) 30 tablet 0  ? ?No current facility-administered medications on file prior to visit.  ? ?No Known Allergies ?Social History  ? ?Socioeconomic History  ? Marital status: Married  ?  Spouse name: Not on file  ? Number of children: Not on file  ? Years of education: Not on file  ? Highest education level: Not on file  ?Occupational History  ? Not on file  ?Tobacco Use  ? Smoking status: Former  ? Smokeless tobacco: Never  ?Substance and Sexual Activity  ? Alcohol use: Yes  ? Drug use: No  ? Sexual activity: Yes  ?  Birth control/protection: None  ?Other Topics Concern  ? Not on file  ?Social History Narrative  ? Not on file  ? ?Social Determinants of Health  ? ?Financial Resource Strain: Not on file  ?Food Insecurity: Not on file  ?Transportation Needs: Not on file  ?Physical Activity: Not on file  ?Stress: Not on file  ?Social Connections: Not on file  ?Intimate Partner Violence: Not on file  ? ? ?No family  history on file. ?She denies any family history of premature cardiovascular disease, colon cancer, breast cancer ? ? ? ?Review of Systems  ?Gastrointestinal:  Positive for abdominal pain.  ?All other systems reviewed and are negative. ? ?   ?Objective:  ? Physical Exam ?Vitals reviewed.  ?Constitutional:   ?   General: She is not in acute distress. ?   Appearance: Normal appearance. She is well-developed and normal weight. She is not ill-appearing, toxic-appearing or diaphoretic.  ?HENT:  ?   Head: Normocephalic and atraumatic.  ?   Right Ear: Tympanic membrane and ear canal normal.  ?   Left Ear: Tympanic membrane and ear canal normal.  ?   Nose: Nose normal.  ?   Mouth/Throat:  ?   Mouth: Mucous membranes are moist.  ?   Pharynx: Oropharynx is clear. No oropharyngeal exudate.  ?Eyes:  ?   Extraocular Movements: Extraocular movements intact.  ?   Conjunctiva/sclera: Conjunctivae normal.  ?   Pupils: Pupils are equal, round, and reactive to light.  ?Neck:  ?   Thyroid: No thyromegaly.  ?   Vascular: No carotid bruit or JVD.  ?   Trachea: No tracheal deviation.  ?Cardiovascular:  ?   Rate and Rhythm: Normal rate and regular  rhythm.  ?   Heart sounds: Normal heart sounds. No murmur heard. ?  No friction rub. No gallop.  ?Pulmonary:  ?   Effort: Pulmonary effort is normal. No respiratory distress.  ?   Breath sounds: Normal breath sounds. No stridor. No wheezing, rhonchi or rales.  ?Chest:  ?   Chest wall: No tenderness.  ?Abdominal:  ?   General: Bowel sounds are normal. There is no distension.  ?   Palpations: Abdomen is soft. There is no mass.  ?   Tenderness: There is no abdominal tenderness. There is no guarding or rebound.  ?   Hernia: No hernia is present.  ?Genitourinary: ?   General: Normal vulva.  ?   Vagina: No vaginal discharge.  ?   Cervix: No cervical motion tenderness.  ?   Uterus: Normal.   ?   Adnexa: Right adnexa normal and left adnexa normal.  ?Musculoskeletal:  ?   Cervical back: Normal range of  motion and neck supple.  ?   Right knee: No effusion, erythema or bony tenderness. Normal range of motion. No tenderness. No LCL laxity, MCL laxity or ACL laxity. Normal alignment and normal meniscus.  ?   Right lower leg: No edema.  ?   Left lower leg: No edema.  ?Lymphadenopathy:  ?   Cervical: No cervical adenopathy.  ?Skin: ?   General: Skin is warm.  ?   Coloration: Skin is not jaundiced or pale.  ?   Findings: No bruising, erythema, lesion or rash.  ?Neurological:  ?   General: No focal deficit present.  ?   Mental Status: She is alert and oriented to person, place, and time. Mental status is at baseline.  ?   Cranial Nerves: No cranial nerve deficit.  ?   Motor: No weakness or abnormal muscle tone.  ?   Coordination: Coordination normal.  ?   Gait: Gait normal.  ?   Deep Tendon Reflexes: Reflexes are normal and symmetric.  ?Psychiatric:     ?   Behavior: Behavior normal.     ?   Thought Content: Thought content normal.     ?   Judgment: Judgment normal.  ? ? ? ? ? ?   ?Assessment & Plan:  ?Encounter for screening mammogram for malignant neoplasm of breast - Plan: MM Digital Screening ? ?Colon cancer screening - Plan: Ambulatory referral to Gastroenterology ? ?Cervical cancer screening - Plan: PAP, Thin Prep w/HPV rflx HPV Type 16/18 ? ?Screening cholesterol level - Plan: CBC with Differential/Platelet, Lipid panel, COMPLETE METABOLIC PANEL WITH GFR ? ?General medical exam - Plan: CBC with Differential/Platelet, Lipid panel, COMPLETE METABOLIC PANEL WITH GFR ?Patient's physical exam is completely normal.  Her blood pressure is outstanding.  I will refer her to GI for colonoscopy.  I will schedule her for mammogram.  Her Pap smear was performed today and sent to pathology in a labeled container.  We will check a CBC CMP and a lipid panel.  I recommended the shingles vaccine.  We discussed the COVID shot. ?

## 2021-07-25 LAB — CBC WITH DIFFERENTIAL/PLATELET
Absolute Monocytes: 684 cells/uL (ref 200–950)
Basophils Absolute: 30 cells/uL (ref 0–200)
Basophils Relative: 0.4 %
Eosinophils Absolute: 68 cells/uL (ref 15–500)
Eosinophils Relative: 0.9 %
HCT: 44.8 % (ref 35.0–45.0)
Hemoglobin: 15.1 g/dL (ref 11.7–15.5)
Lymphs Abs: 1573 cells/uL (ref 850–3900)
MCH: 30.8 pg (ref 27.0–33.0)
MCHC: 33.7 g/dL (ref 32.0–36.0)
MCV: 91.4 fL (ref 80.0–100.0)
MPV: 11.3 fL (ref 7.5–12.5)
Monocytes Relative: 9 %
Neutro Abs: 5244 cells/uL (ref 1500–7800)
Neutrophils Relative %: 69 %
Platelets: 264 10*3/uL (ref 140–400)
RBC: 4.9 10*6/uL (ref 3.80–5.10)
RDW: 12.1 % (ref 11.0–15.0)
Total Lymphocyte: 20.7 %
WBC: 7.6 10*3/uL (ref 3.8–10.8)

## 2021-07-25 LAB — COMPLETE METABOLIC PANEL WITH GFR
AG Ratio: 2 (calc) (ref 1.0–2.5)
ALT: 21 U/L (ref 6–29)
AST: 23 U/L (ref 10–35)
Albumin: 4.5 g/dL (ref 3.6–5.1)
Alkaline phosphatase (APISO): 41 U/L (ref 37–153)
BUN: 12 mg/dL (ref 7–25)
CO2: 27 mmol/L (ref 20–32)
Calcium: 9.8 mg/dL (ref 8.6–10.4)
Chloride: 102 mmol/L (ref 98–110)
Creat: 0.91 mg/dL (ref 0.50–1.03)
Globulin: 2.3 g/dL (calc) (ref 1.9–3.7)
Glucose, Bld: 92 mg/dL (ref 65–99)
Potassium: 4.7 mmol/L (ref 3.5–5.3)
Sodium: 137 mmol/L (ref 135–146)
Total Bilirubin: 1.4 mg/dL — ABNORMAL HIGH (ref 0.2–1.2)
Total Protein: 6.8 g/dL (ref 6.1–8.1)
eGFR: 76 mL/min/{1.73_m2} (ref >=60)

## 2021-07-25 LAB — LIPID PANEL
Cholesterol: 212 mg/dL — ABNORMAL HIGH (ref <200)
HDL: 81 mg/dL (ref >=50)
LDL Cholesterol (Calc): 116 mg/dL (calc) — ABNORMAL HIGH
Non-HDL Cholesterol (Calc): 131 mg/dL (calc) — ABNORMAL HIGH (ref <130)
Total CHOL/HDL Ratio: 2.6 (calc) (ref <5.0)
Triglycerides: 56 mg/dL (ref <150)

## 2021-07-28 LAB — PAP, TP IMAGING W/ HPV RNA, RFLX HPV TYPE 16,18/45: HPV DNA High Risk: NOT DETECTED

## 2021-08-05 ENCOUNTER — Ambulatory Visit (INDEPENDENT_AMBULATORY_CARE_PROVIDER_SITE_OTHER): Admitting: Psychiatry

## 2021-08-05 DIAGNOSIS — F331 Major depressive disorder, recurrent, moderate: Secondary | ICD-10-CM

## 2021-08-05 NOTE — Progress Notes (Signed)
?    [facility] Counselor/Therapist Progress Note ? ?Patient ID: Penny Reid, MRN: [id number],   ? ?Date: 08/05/2021 ? ?Time Spent: 55 minutes  ? ?Treatment Type: Individual Therapy ? ?Reported Symptoms:  anxiety, stress of parenting 2 teenage kids with their behavioral health issues ? ?Mental Status Exam: ? ?Appearance:   Casual     ?Behavior:  Appropriate, Sharing, and Motivated  ?Motor:  Normal  ?Speech/Language:   Clear and Coherent  ?Affect:  anxious  ?Mood:  anxious  ?Thought process:  goal directed  ?Thought content:    overthinking  ?Sensory/Perceptual disturbances:    WNL  ?Orientation:  oriented to person, place, time/date, situation, day of week, month of year, year, and stated date of Aug 05, 2021  ?Attention:  Good  ?Concentration:  Good  ?Memory:  WNL  ?Fund of knowledge:   Good  ?Insight:    Good  ?Judgment:   Good  ?Impulse Control:  Fair  ? ?Risk Assessment: ?Danger to Self:  No ?Self-injurious Behavior: No ?Danger to Others: No ?Duty to Warn:no ?Physical Aggression / Violence:No  ?Access to Firearms a concern: No  ?Gang Involvement:No  ? ?Subjective: Patient today reports continued anxiety as she manages trying to work with her son and daughter both of whom have challenges behaviorally and are seeing their own therapists. Discusses today more challenges in parenting both kids with their specific needs. (Ages 30 NS 3). Heightened issues with her daughter's mental health, and ended up at ER 2 nights. Patient talked through current situations and her concerns/fears. Tearfully shared and processed her feelings re: daughter and daughter's emotional/behavioral struggles. Does feel the past year has been "a little better at times" than last year, but daughter is still unpredictable and "I never know what may set her off." Shares her pain and grief, and also looked at some of her strengths especially in trying to remain calm in the face of challenges with her kids. Homeschooling  kids has helped  both kids and patient feels good in reflecting on that. Discussed her own self-care needs and what she can do to strengthen herself. Wants to pursue more next session about having consequences for the choices her kids make that are not good and "how to be better at enforcing consequences without it being a fight  or major negative event.".  Difficulty balancing care of the kids and in having her own positive self-care which she acknowledges is often lacking.  Continues to work at reducing guilt and showing progress with this.  Patient continues to encourage husband to decrease drinking however he does not feel he has a problem.  Patient acknowledges she needs to continue working on more positive self-care, and having some healthier boundaries in place. ? ?Interventions: Solution-Oriented/Positive Psychology and Insight-Oriented ? ?Treatment goal plan: ?Patient not signing treatment plan on computer screen due to [place]. ?Treatment goals: ?Treatment goals remain on treatment plan as patient works with strategies to achieve her goals.  Progress is assessed each session and documented in "subject" and/or "plan" section of treatment note. ?Long-term goal: ?Elevate mood and show evidence of usual energy, activities, and socialization level. ?Short-term goal: ?Make positive statements regarding self and her ability to cope with life stressors. ?Strategies: ?Reinforced positive, reality based cognitive messages that enhance self-confidence and increase adaptive action ? ?Diagnosis: ?  ICD-10-CM   ?1. Depression, major, recurrent, moderate (HCC)  F33.1   ?  ? ?Plan:   Patient in today showing good motivation and actively participated in session  as she worked more on her significant anxiety and stress of parenting 2 teenage children, both with significant behavioral health concerns and are in their own treatment.  Motivated. She feels positive about some aspects of her parenting and yet ends up feeling uncertain or negative  about other aspects which she shared in her session today.  Encouraged patient to not look at some of her parenting traits as being "bad or negative", and rather accept the fact that she is in a difficult position and sometimes choices are not easy but to look at those things she feels she might need to change, realize that she cannot totally control the behavioral responses of her children, and also be able to recognize the times when she does well in her parental responses especially in those involving difficult behaviors. Encouraged patient to be practicing more consistent behaviors as discussed in session including: Not defining herself as a bad mom, noticing the positives as quickly as she notices the negatives, staying in touch with people who are supportive, get outside daily, healthy nutrition and exercise, not assuming worst case scenarios, practicing positive self talk, reducing overthinking and over analyzing, challenge and counteract self doubt, interrupt negative/anxious thoughts and challenge them to replace with more reality-based thoughts, saying no when she needs to say no, letting go of guilt from the past that holds her back as discussed in sessions, continue her work on control and perfectionistic issues, reflect on her progress more frequently, and recognize the strength she shows working with goal directed behaviors to move in a direction that supports her improved emotional health, self-confidence, and overall outlook. ? ?Goal review and progress/challenges noted with patient. ? ?Next appointment within 2-3 weeks. ?This record has been created using [employer].  Chart creation errors have been sought, but may not always have been located and corrected.  Such creation errors do not reflect on the standard of medical care provided. ? ? ?Shanon Ace, LCSW ? ? ? ? ? ? ? ? ? ? ? ? ? ? ? ? ? ? ?

## 2021-08-14 ENCOUNTER — Ambulatory Visit: Admitting: Psychiatry

## 2021-08-19 ENCOUNTER — Ambulatory Visit: Admitting: Psychiatry

## 2021-08-20 ENCOUNTER — Other Ambulatory Visit: Payer: Self-pay | Admitting: Family Medicine

## 2021-08-20 ENCOUNTER — Ambulatory Visit
Admission: RE | Admit: 2021-08-20 | Discharge: 2021-08-20 | Disposition: A | Discharge status: Home / Self Care | Source: Ambulatory Visit | Attending: Family Medicine | Admitting: Family Medicine

## 2021-08-20 DIAGNOSIS — Z1231 Encounter for screening mammogram for malignant neoplasm of breast: Secondary | ICD-10-CM

## 2021-09-02 ENCOUNTER — Ambulatory Visit: Admitting: Psychiatry

## 2021-09-04 ENCOUNTER — Encounter: Payer: Self-pay | Admitting: Internal Medicine

## 2021-09-16 ENCOUNTER — Ambulatory Visit (INDEPENDENT_AMBULATORY_CARE_PROVIDER_SITE_OTHER): Admitting: Psychiatry

## 2021-09-16 DIAGNOSIS — F411 Generalized anxiety disorder: Secondary | ICD-10-CM | POA: Diagnosis not present

## 2021-09-16 NOTE — Progress Notes (Signed)
[facility] Counselor/Therapist Progress Note  Patient ID: Penny Reid, MRN: [id number],    Date: 09/16/2021  Time Spent: 55 minutes   Treatment Type: Individual Therapy  Reported Symptoms: anxiety  Mental Status Exam:  Appearance:   Casual and Neat     Behavior:  Appropriate, Sharing, and Motivated  Motor:  Normal  Speech/Language:   Clear and Coherent  Affect:  anxious  Mood:  anxious  Thought process:  goal directed  Thought content:    WNL  Sensory/Perceptual disturbances:    WNL  Orientation:  oriented to person, place, time/date, situation, day of week, month of year, year, and stated date of September 16, 2021  Attention:  Good  Concentration:  Good  Memory:  WNL  Fund of knowledge:   Good  Insight:    Good  Judgment:   Good  Impulse Control:  Good   Risk Assessment: Danger to Self:  No Self-injurious Behavior: No Danger to Others: No Duty to Warn:no Physical Aggression / Violence:No  Access to Firearms a concern: No  Gang Involvement:No   Subjective:  Patient in today reporting anxiety and needed to talk through recent family relationship issues, complicated with unexpected traumatic damages at their home which hopefully all the repairs are going to be completed soon. This has been very stressful for whole family, especially patient who was supporting other family members and dealing with her own frustrations and anger.  Continues to work hard at balancing the care of her kids while patient herself deals with some emotional health challenges.  Very helpful to her today to be able to vent and process thoughts and feelings, and also plans for moving forward. Continues to support her son and daughter, ages 73 and 55 and both have emotional health issues. Focused on her own self-care emotionally especially during times of heightened stress. Continued progress in reducing guilt re: family relationships especially parenting concerns.  Patient concerned that her  husband was drinking more a little recently which may have been due to some of the stressors they have encountered with their house and massive repairs.**(Note for next session, patient wants to discuss "having consequences for the choices that her kids make that are not good, not wanting the consequences to be fair, and how to enforce consequences without it being a fight or major negative event.")   Interventions: Solution-Oriented/Positive Psychology and Insight-Oriented  Treatment goals: Treatment goals remain on treatment plan as patient works with strategies to achieve her goals.  Progress is assessed each session and documented in "subject" and/or "plan" section of treatment note. Long-term goal: Elevate mood and show evidence of usual energy, activities, and socialization level. Short-term goal: Make positive statements regarding self and her ability to cope with life stressors. Strategies: Reinforced positive, reality based cognitive messages that enhance self-confidence and increase adaptive action  Diagnosis:   ICD-10-CM   1. Generalized anxiety disorder  F41.1      Plan:   Patient in today showing active participation and good motivation as she focused on recent traumatic situation at her home with significant damage and repairs being needed and has taken almost 8 months.  This has been very trying for patient and her family which she vented and processed a lot of her feelings regarding all that has happened and how this has escalated family tensions at times, but she seemed to manage things as positively as possible and I encouraged her to feel good about that versus getting caught up and  self blame.  Worked further today on some parenting issues and limit setting, and understanding that she cannot totally control the behavioral responses of her children and that this does not mean that she is not a good parent. Encouraged patient in practicing consistent positive behaviors including:  Choosing to not define herself as a "bad mom", notice the positives as quickly as she notices the negatives, staying in touch with people who are supportive, get outside daily, healthy nutrition and exercise, not assuming worst case scenarios, practicing positive self talk, reducing overthinking and over analyzing, challenge and counteract self doubt, interrupt negative/anxious thoughts and challenge them to replace with more reality-based thoughts, saying no when she needs to say no, letting go of guilt from the past that holds her back as discussed in sessions, continue her work on control and perfectionistic issues, reflect on her progress more often, and realize the strength she shows working with goal directed behaviors to move in a direction that supports her improved emotional health.  Goal review and progress/challenges noted with patient.  Next appointment within 2 to 3 weeks.  This record has been created using [employer].  Chart creation errors have been sought, but may not always have been located and corrected.  Such creation errors do not reflect on the standard of medical care provided.   Mathis Fare, LCSW

## 2021-09-30 ENCOUNTER — Ambulatory Visit (INDEPENDENT_AMBULATORY_CARE_PROVIDER_SITE_OTHER): Admitting: Psychiatry

## 2021-09-30 DIAGNOSIS — F411 Generalized anxiety disorder: Secondary | ICD-10-CM | POA: Diagnosis not present

## 2021-09-30 NOTE — Progress Notes (Signed)
[facility] Counselor/Therapist Progress Note  Patient ID: Penny Reid, MRN: [id number],    Date: 09/30/2021  Time Spent: 57 minutes   Treatment Type: Individual Therapy  Reported Symptoms: anxious, overwhelmed, frustrated, some depression  Mental Status Exam:  Appearance:   Casual     Behavior:  Appropriate, Sharing, and Motivated  Motor:  Normal  Speech/Language:   Clear and Coherent  Affect:  Anxious, tired  Mood:  anxious and frustrated  Thought process:  goal directed  Thought content:    overthinking  Sensory/Perceptual disturbances:    WNL  Orientation:  oriented to person, place, time/date, situation, day of week, month of year, year, and stated date of September 30, 2021  Attention:  Good  Concentration:  Good  Memory:  WNL  Fund of knowledge:   Good  Insight:    Good  Judgment:   Good  Impulse Control:  Good   Risk Assessment: Danger to Self:  No Self-injurious Behavior: No Danger to Others: No Duty to Warn:no Physical Aggression / Violence:No  Access to Firearms a concern: No  Gang Involvement:No   Subjective: Patient in today reporting anxiety, some depression, stressed, and frustrated. Symptoms mostly related to personal, parenting, and family situations which she shared and discussed more in session today. Difficult to set healthy boundaries in some situations. Home damages mentioned last session are some better and waiting for work to be completed. Heightened issues with daughter. Daughter very difficult to get along with. Patient feeling stressed and second-guesses herself at times but actually seems to be doing as well as possible and interactions with her daughter.  Very good at not holding onto anger and choosing to respond when she can be more leveled out.  Difficult times with both son and daughter struggling with emotional health issues and are receiving help.  Continue to focus on patient's own self-care especially emotionally and during times  when the stress at home is heightened.  Seems to be having less guilt today which is a positive sign.  Did not get to 1 issue today that we had wanted to so will plan to focus on it next session. **(Note for next session, patient wants to discuss "having consequences for the choices that her kids make that are not good, not wanting the consequences to be fair, and how to enforce consequences without it being a fight or major negative event.")  Interventions: Cognitive Behavioral Therapy  Treatment goals: Treatment goals remain on treatment plan as patient works with strategies to achieve her goals.  Progress is assessed each session and documented in "subject" and/or "plan" section of treatment note. Long-term goal: Elevate mood and show evidence of usual energy, activities, and socialization level. Short-term goal: Make positive statements regarding self and her ability to cope with life stressors. Strategies: Reinforced positive, reality based cognitive messages that enhance self-confidence and increase adaptive action  Diagnosis:   ICD-10-CM   1. Generalized anxiety disorder  F41.1      Plan:  Patient in today showing good motivation and participation in session as she is frustrated, anxious, about daughter's challenges in behavioral health that are very difficult to treat. Focused on patient's own self-care and boundary setting as she continues to help support her daughter in her own therapy and in midst of considering some needed therapeutic changes. Family tensions "not quite as bad" but very unpredictable and daughter is a significant factor and concern. Patient is doing a good job in managing situations in the  home and also pursuing further treatment for her daughter that  is appropriate for her needs.  Reviewed some of her work from prior session in reference to parenting issues and behavioral responses, limit setting and affirming with patient that she is a good parent in the midst of  very challenging circumstances. Encouraged patient and practicing positive self-care and other positive behaviors including: Not defining herself as a "bad mom", noticing the positives as quickly as she notices the negatives, staying in touch with people who are supportive, getting outside daily, healthy nutrition and exercise, not assuming worst case scenarios, positive self talk, reducing overthinking over analyzing, challenge and counteract self doubt, interrupt negative/anxious thoughts and challenge them to replace with more reality-based thoughts, saying no when she needs to say no, letting go of guilt from the past that holds her back as discussed in sessions, continue her work on control and perfectionistic issues, reflect on her progress more often, and recognize the strength she shows working with goal directed behaviors to move in a direction that supports her improved emotional health and overall outlook  Goal review and progress/challenges noted with patient.  Next appointment within 2 to 3 weeks.  This record has been created using [employer].  Chart creation errors have been sought, but may not always have been located and corrected.  Such creation errors do not reflect on the standard of medical care provided.   Shanon Ace, LCSW

## 2021-10-02 ENCOUNTER — Ambulatory Visit (AMBULATORY_SURGERY_CENTER): Payer: Self-pay

## 2021-10-02 VITALS — Ht 65.5 in | Wt 167.0 lb

## 2021-10-02 DIAGNOSIS — Z1211 Encounter for screening for malignant neoplasm of colon: Secondary | ICD-10-CM

## 2021-10-02 MED ORDER — NA SULFATE-K SULFATE-MG SULF 17.5-3.13-1.6 GM/177ML PO SOLN: 1.0000 | Freq: Once | ORAL | 0 refills | Status: AC

## 2021-10-02 NOTE — Progress Notes (Signed)
No egg or soy allergy known to patient  No issues known to pt with past sedation with any surgeries or procedures Patient denies ever being told they had issues or difficulty with intubation  No FH of Malignant Hyperthermia Pt is not on diet pills Pt is not on home 02  Pt is not on blood thinners  Pt denies issues with constipation  No A fib or A flutter Have any cardiac testing pending--NO Pt instructed to use [email or web address] or GoodRx for a price reduction on prep   

## 2021-10-14 ENCOUNTER — Ambulatory Visit (INDEPENDENT_AMBULATORY_CARE_PROVIDER_SITE_OTHER): Admitting: Psychiatry

## 2021-10-14 DIAGNOSIS — F411 Generalized anxiety disorder: Secondary | ICD-10-CM | POA: Diagnosis not present

## 2021-10-14 NOTE — Progress Notes (Signed)
[facility] Counselor/Therapist Progress Note  Patient ID: Penny Reid, MRN: [id number],    Date: 10/14/2021  Time Spent: 55 minutes   Treatment Type: Individual Therapy  Reported Symptoms: anxiety, depression  Mental Status Exam:  Appearance:   Casual     Behavior:  Appropriate, Sharing, and Motivated  Motor:  Normal  Speech/Language:   Clear and Coherent  Affect:  Anxious, depressed  Mood:  anxious and depressed  Thought process:  goal directed  Thought content:    WNL  Sensory/Perceptual disturbances:    WNL  Orientation:  oriented to person, place, time/date, situation, day of week, month of year, year, and stated date of October 14, 2021  Attention:  Good  Concentration:  Good  Memory:  WNL  Fund of knowledge:   Good  Insight:    Good  Judgment:   Good  Impulse Control:  Good   Risk Assessment: Danger to Self:  No Self-injurious Behavior: No Danger to Others: No Duty to Warn:no Physical Aggression / Violence:No  Access to Firearms a concern: No  Gang Involvement:No   Subjective:  Patient in today reporting symptoms of anxiety and some depression as she continues parenting challenges with 2 children, both getting their own [place] treatment, daughter more long-term. Processing recent behavioral issues but also noticing some less intensity mostly with her son's behavior. Patient does show some good management of their behaviors but really struggling with her daughter and "her undercurrent of negative feelings expressed towards me", which she discussed more today. Boundary setting is difficult. Self-doubt and second-guessing herself continues but is working on this. Sharing some of her sadness, regrets,and "anger that I work hard not to hold onto." Questions herself about "being a good mother". Discussed more issues that relate to her parenting especially as the kids are getting older, ages 66 almost 3.  Homework assignment given about boundaries and consequences.   Encouraged her having time for herself as she is able and other ways of being more self caring.  Interventions: Cognitive Behavioral Therapy   Treatment goals: Treatment goals remain on treatment plan as patient works with strategies to achieve her goals.  Progress is assessed each session and documented in "subject" and/or "plan" section of treatment note. Long-term goal: Elevate mood and show evidence of usual energy, activities, and socialization level. Short-term goal: Make positive statements regarding self and her ability to cope with life stressors. Strategies: Reinforced positive, reality based cognitive messages that enhance self-confidence and increase adaptive action  Diagnosis:   ICD-10-CM   1. Generalized anxiety disorder  F41.1      Plan:  Patient today showing good participation and motivation in session focusing on her anxiety and depression mostly centered around parenting issues with her 2 children ages 10 and 40 both of whom have some behavioral challenges and are seeing a therapist.  Patient processed several incidents since her last appointment and how she managed them, along with the outcome.  Some self criticalness but is working to decrease that overall.  More challenged by the older child where she senses a strong "undercurrent of negative feelings expressed towards me".  Patient working on developing better consequences for her children when their behavior is unacceptable and trying to make the consequences something that can help them learn versus just "punishment".  Patient report how it helps her to be able to vent and talk about some of the family issues in detail as she does not wish to share them with other  people who do not really understand behavioral health and can be quick to judge.  To follow through in her homework assignment as noted above and bring it with her at next session which we will discuss. Encouraged patient in her practice of positive and self  caring behaviors including: Noticing the positives as quickly as she notices the negatives, staying in touch with people who are supportive, getting outside daily, healthy nutrition and exercise, not defining herself as a "bad mom", not assuming worst case scenarios, positive self talk, reduce overthinking and over analyzing, challenge and counteract her self doubt, interrupt negative/anxious thoughts and challenge them to replace with more reality-based thoughts, saying no when she needs to say no, letting go of guilt from the past that holds her back, continue her work on control and perfectionistic issues, reflect on her progress more often, and realize the strength she shows working with goal directed behaviors to move in a direction that supports her improved emotional health and self-confidence.  Goal review and progress/challenges noted with patient.  Next appointment within 2-3 weeks.  This record has been created using [employer].  Chart creation errors have been sought, but may not always have been located and corrected.  Such creation errors do not reflect on the standard of medical care provided.   Shanon Ace, LCSW

## 2021-10-19 ENCOUNTER — Encounter: Payer: Self-pay | Admitting: Certified Registered Nurse Anesthetist

## 2021-10-23 ENCOUNTER — Ambulatory Visit (AMBULATORY_SURGERY_CENTER): Admitting: Internal Medicine

## 2021-10-23 ENCOUNTER — Encounter: Payer: Self-pay | Admitting: Internal Medicine

## 2021-10-23 VITALS — BP 93/63 | HR 55 | Temp 98.9°F | Resp 14 | Ht 65.5 in | Wt 167.0 lb

## 2021-10-23 DIAGNOSIS — D122 Benign neoplasm of ascending colon: Secondary | ICD-10-CM | POA: Diagnosis not present

## 2021-10-23 DIAGNOSIS — Z1211 Encounter for screening for malignant neoplasm of colon: Secondary | ICD-10-CM | POA: Diagnosis present

## 2021-10-23 MED ORDER — SODIUM CHLORIDE 0.9 % IV SOLN: 500.0000 mL | Freq: Once | INTRAVENOUS | Status: DC

## 2021-10-23 NOTE — Progress Notes (Signed)
Pt's states no medical or surgical changes since previsit or office visit. 

## 2021-10-23 NOTE — Op Note (Addendum)
[facility] Patient Name: Penny Reid Procedure Date: 10/23/2021 8:10 AM MRN: [id number] Endoscopist: Sonny Masters "Penny Reid ,  Age: 51 Referring MD:  Date of Birth: 01/20/1971 Gender: Female Account #: [id number] Procedure:                Colonoscopy Indications:              Screening for colorectal malignant neoplasm, This                            is the patient's first colonoscopy Medicines:                Monitored Anesthesia Care Procedure:                Pre-Anesthesia Assessment:                           - Prior to the procedure, a History and Physical                            was performed, and patient medications and                            allergies were reviewed. The patient's tolerance of                            previous anesthesia was also reviewed. The risks                            and benefits of the procedure and the sedation                            options and risks were discussed with the patient.                            All questions were answered, and informed consent                            was obtained. Prior Anticoagulants: The patient has                            taken no previous anticoagulant or antiplatelet                            agents. ASA Grade Assessment: II - A patient with                            mild systemic disease. After reviewing the risks                            and benefits, the patient was deemed in                            satisfactory condition to undergo the procedure.  After obtaining informed consent, the colonoscope                            was passed under direct vision. Throughout the                            procedure, the patient's blood pressure, pulse, and                            oxygen saturations were monitored continuously. The                            [id number] was introduced through the anus and advanced                            to the the  terminal ileum. The colonoscopy was                            performed without difficulty. The patient tolerated                            the procedure well. The quality of the bowel                            preparation was good. The terminal ileum, ileocecal                            valve, appendiceal orifice, and rectum were                            photographed. Scope In: 8:21:48 AM Scope Out: 8:44:28 AM Scope Withdrawal Time: 0 hours 19 minutes 48 seconds  Total Procedure Duration: 0 hours 22 minutes 40 seconds  Findings:                 The terminal ileum appeared normal.                           Four sessile polyps were found in the ascending                            colon. The polyps were 3 to 6 mm in size. These                            polyps were removed with a cold snare. Resection                            and retrieval were complete.                           Non-bleeding internal hemorrhoids were found during                            retroflexion. Complications:  No immediate complications. Estimated Blood Loss:     Estimated blood loss was minimal. Impression:               - The examined portion of the ileum was normal.                           - Four 3 to 6 mm polyps in the ascending colon,                            removed with a cold snare. Resected and retrieved.                           - Non-bleeding internal hemorrhoids. Recommendation:           - Discharge patient to home (with escort).                           - Await pathology results.                           - The findings and recommendations were discussed                            with the patient. Sonny Masters "Penny Reid,  10/23/2021 8:47:55 AM

## 2021-10-23 NOTE — Patient Instructions (Signed)
Await pathology results  YOU HAD AN ENDOSCOPIC PROCEDURE TODAY AT [facility]:   Refer to the procedure report that was given to you for any specific questions about what was found during the examination.  If the procedure report does not answer your questions, please call your gastroenterologist to clarify.  If you requested that your care partner not be given the details of your procedure findings, then the procedure report has been included in a sealed envelope for you to review at your convenience later.  YOU SHOULD EXPECT: Some feelings of bloating in the abdomen. Passage of more gas than usual.  Walking can help get rid of the air that was put into your GI tract during the procedure and reduce the bloating. If you had a lower endoscopy (such as a colonoscopy or flexible sigmoidoscopy) you may notice spotting of blood in your stool or on the toilet paper. If you underwent a bowel prep for your procedure, you may not have a normal bowel movement for a few days.  Please Note:  You might notice some irritation and congestion in your nose or some drainage.  This is from the oxygen used during your procedure.  There is no need for concern and it should clear up in a day or so.  SYMPTOMS TO REPORT IMMEDIATELY:  Following lower endoscopy (colonoscopy or flexible sigmoidoscopy):  Excessive amounts of blood in the stool  Significant tenderness or worsening of abdominal pains  Swelling of the abdomen that is new, acute  Fever of 100F or higher  For urgent or emergent issues, a gastroenterologist can be reached at any hour by calling [phone]. Do not use MyChart messaging for urgent concerns.    DIET:  We do recommend a small meal at first, but then you may proceed to your regular diet.  Drink plenty of fluids but you should avoid alcoholic beverages for 24 hours.  ACTIVITY:  You should plan to take it easy for the rest of today and you should NOT DRIVE or use heavy machinery  until tomorrow (because of the sedation medicines used during the test).    FOLLOW UP: Our staff will call the number listed on your records the next business day following your procedure.  We will call around 7:15- 8:00 am to check on you and address any questions or concerns that you may have regarding the information given to you following your procedure. If we do not reach you, we will leave a message.  If you develop any symptoms (ie: fever, flu-like symptoms, shortness of breath, cough etc.) before then, please call [phone].  If you test positive for Covid 19 in the 2 weeks post procedure, please call and report this information to [place].    If any biopsies were taken you will be contacted by phone or by letter within the next 1-3 weeks.  Please call us at [phone] if you have not heard about the biopsies in 3 weeks.    SIGNATURES/CONFIDENTIALITY: You and/or your care partner have signed paperwork which will be entered into your electronic medical record.  These signatures attest to the fact that that the information above on your After Visit Summary has been reviewed and is understood.  Full responsibility of the confidentiality of this discharge information lies with you and/or your care-partner.

## 2021-10-23 NOTE — Progress Notes (Signed)
Report given to PACU, vss 

## 2021-10-23 NOTE — Progress Notes (Signed)
GASTROENTEROLOGY PROCEDURE H&P NOTE   Primary Care Physician: Susy Frizzle, MD    Reason for Procedure:   Colon cancer screening  Plan:    Colonoscopy  Patient is appropriate for endoscopic procedure(s) in the ambulatory ([place]) setting.  The nature of the procedure, as well as the risks, benefits, and alternatives were carefully and thoroughly reviewed with the patient. Ample time for discussion and questions allowed. The patient understood, was satisfied, and agreed to proceed.     HPI: Penny Reid is a 51 y.o. female who presents for colonoscopy for colon cancer screening. Denies blood in stools, changes in bowel habits,weight loss. Denies family history of colon cancer.   Past Medical History:  Diagnosis Date   Anxiety    GERD (gastroesophageal reflux disease)    in times of stress   Tobacco consumption    quit 2018    Past Surgical History:  Procedure Laterality Date   AUGMENTATION MAMMAPLASTY Bilateral 2008   OTHER SURGICAL HISTORY     removal  of MRSA lesion 4/06    Prior to Admission medications   Medication Sig Start Date End Date Taking? Authorizing Provider  B Complex Vitamins (VITAMIN B COMPLEX PO) Take 1 tablet by mouth daily at 6 (six) AM.    [provider]  CALCIUM PO Take 1 tablet by mouth daily at 6 (six) AM.    [provider]  meclizine (ANTIVERT) 25 MG tablet Take 1 tablet (25 mg total) by mouth 3 (three) times daily as needed for dizziness. 10/14/20   Eulogio Bear, NP    Current Outpatient Medications  Medication Sig Dispense Refill   B Complex Vitamins (VITAMIN B COMPLEX PO) Take 1 tablet by mouth daily at 6 (six) AM.     CALCIUM PO Take 1 tablet by mouth daily at 6 (six) AM.     meclizine (ANTIVERT) 25 MG tablet Take 1 tablet (25 mg total) by mouth 3 (three) times daily as needed for dizziness. 30 tablet 0   Current Facility-Administered Medications  Medication Dose Route Frequency Provider Last Rate Last  Admin   0.9 %  sodium chloride infusion  500 mL Intravenous Once Sharyn Creamer, MD        Allergies as of 10/23/2021   (No Known Allergies)    Family History  Problem Relation Age of Onset   Colon polyps Neg Hx    Colon cancer Neg Hx    Esophageal cancer Neg Hx    Stomach cancer Neg Hx    Rectal cancer Neg Hx     Social History   Socioeconomic History   Marital status: Married    Spouse name: Not on file   Number of children: Not on file   Years of education: Not on file   Highest education level: Not on file  Occupational History   Not on file  Tobacco Use   Smoking status: Former    Packs/day: 1.00    Years: 27.00    Total pack years: 27.00    Types: Cigarettes   Smokeless tobacco: Never  Vaping Use   Vaping Use: Every day  Substance and Sexual Activity   Alcohol use: Not Currently   Drug use: No   Sexual activity: Yes    Birth control/protection: None  Other Topics Concern   Not on file  Social History Narrative   Not on file   Social Determinants of Health   Financial Resource Strain: Not on file  Food  Insecurity: Not on file  Transportation Needs: Not on file  Physical Activity: Not on file  Stress: Not on file  Social Connections: Not on file  Intimate Partner Violence: Not on file    Physical Exam: Vital signs in last 24 hours: BP 107/67   Pulse 84   Temp 98.9 F (37.2 C)   Ht 5' 5.5" (1.664 m)   Wt 167 lb (75.8 kg)   SpO2 98%   BMI 27.37 kg/m  GEN: NAD EYE: Sclerae anicteric ENT: MMM CV: Non-tachycardic Pulm: No increased work of breathing GI: Soft, NT/ND NEURO:  Alert & Oriented   Christia Reading, MD [facility]  10/23/2021 7:34 AM

## 2021-10-24 ENCOUNTER — Telehealth: Payer: Self-pay

## 2021-10-24 NOTE — Telephone Encounter (Signed)
  Follow up Call-     10/23/2021    7:31 AM  Call back number  Post procedure Call Back phone  # [phone]  Permission to leave phone message Yes     Patient questions:  Do you have a fever, pain , or abdominal swelling? No. Pain Score  0 *  Have you tolerated food without any problems? Yes.    Have you been able to return to your normal activities? Yes.    Do you have any questions about your discharge instructions: Diet   No. Medications  No. Follow up visit  No.  Do you have questions or concerns about your Care? No.  Actions: * If pain score is 4 or above: No action needed, pain <4.

## 2021-10-27 ENCOUNTER — Encounter: Payer: Self-pay | Admitting: Internal Medicine

## 2021-10-29 ENCOUNTER — Ambulatory Visit: Admitting: Psychiatry

## 2021-11-19 ENCOUNTER — Ambulatory Visit (INDEPENDENT_AMBULATORY_CARE_PROVIDER_SITE_OTHER): Admitting: Psychiatry

## 2021-11-19 DIAGNOSIS — F411 Generalized anxiety disorder: Secondary | ICD-10-CM
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
## 2021-12-03 ENCOUNTER — Encounter (HOSPITAL_COMMUNITY): Payer: Self-pay

## 2021-12-03 ENCOUNTER — Ambulatory Visit (HOSPITAL_COMMUNITY)
Admission: EM | Admit: 2021-12-03 | Discharge: 2021-12-03 | Disposition: A | Discharge status: Home / Self Care | Attending: Internal Medicine | Admitting: Internal Medicine

## 2021-12-03 DIAGNOSIS — J069 Acute upper respiratory infection, unspecified: Secondary | ICD-10-CM

## 2021-12-03 DIAGNOSIS — U071 COVID-19: Secondary | ICD-10-CM | POA: Insufficient documentation

## 2021-12-03 DIAGNOSIS — J029 Acute pharyngitis, unspecified: Secondary | ICD-10-CM

## 2021-12-03 LAB — POCT RAPID STREP A, ED / UC: Streptococcus, Group A Screen (Direct): NEGATIVE

## 2021-12-03 LAB — SARS CORONAVIRUS 2 (TAT 6-24 HRS): SARS Coronavirus 2: POSITIVE — AB

## 2021-12-03 NOTE — ED Triage Notes (Signed)
Pt states sore throat,nasal congestion and fever for the past 3 days.

## 2021-12-03 NOTE — Discharge Instructions (Addendum)
We will contact you if your COVID test is positive.  Please quarantine while you wait for the results.  If your test is negative you may resume normal activities.  If your test is positive please continue to quarantine for at least 5 days from your symptom onset , if symptoms are still present after 5 days please wear masks while completing activities.    You can take Tylenol and/or Ibuprofen as needed for fever reduction and pain relief.   For cough: honey 1/2 to 1 teaspoon (you can dilute the honey in water or another fluid).  You can also use guaifenesin and dextromethorphan for cough. You can use a humidifier for chest congestion and cough.  If you don't have a humidifier, you can sit in the bathroom with the hot shower running.      For sore throat: try warm salt water gargles, cepacol lozenges, throat spray, warm tea or water with lemon/honey, popsicles or ice, or OTC cold relief medicine for throat discomfort.   For congestion: take a daily anti-histamine like Zyrtec, Claritin, and a oral decongestant, such as pseudoephedrine.  You can also use Flonase 1-2 sprays in each nostril daily.   It is important to stay hydrated: drink plenty of fluids (water, gatorade/powerade/pedialyte, juices, or teas) to keep your throat moisturized and help further relieve irritation/discomfort.   It was nice meeting you today!

## 2021-12-03 NOTE — ED Provider Notes (Signed)
Penny Reid   [id number] 12/03/21 Arrival Time: 1245  ASSESSMENT & PLAN:  1. Viral upper respiratory tract infection    -Patient's history and exam are consistent with a viral upper respiratory illness.  I suspect COVID.  We will have her tested for COVID today.  We discussed general supportive measures including rest, fluids, over-the-counter analgesics and decongestions.  Emphasized hand hygiene throughout the spread to others.  We will call her if her test is positive.  All questions answered and she agrees to plan.  No orders of the defined types were placed in this encounter.    Discharge Instructions      We will contact you if your COVID test is positive.  Please quarantine while you wait for the results.  If your test is negative you may resume normal activities.  If your test is positive please continue to quarantine for at least 5 days from your symptom onset , if symptoms are still present after 5 days please wear masks while completing activities.    You can take Tylenol and/or Ibuprofen as needed for fever reduction and pain relief.   For cough: honey 1/2 to 1 teaspoon (you can dilute the honey in water or another fluid).  You can also use guaifenesin and dextromethorphan for cough. You can use a humidifier for chest congestion and cough.  If you don't have a humidifier, you can sit in the bathroom with the hot shower running.      For sore throat: try warm salt water gargles, cepacol lozenges, throat spray, warm tea or water with lemon/honey, popsicles or ice, or OTC cold relief medicine for throat discomfort.   For congestion: take a daily anti-histamine like Zyrtec, Claritin, and a oral decongestant, such as pseudoephedrine.  You can also use Flonase 1-2 sprays in each nostril daily.   It is important to stay hydrated: drink plenty of fluids (water, gatorade/powerade/pedialyte, juices, or teas) to keep your throat moisturized and help further relieve  irritation/discomfort.   It was nice meeting you today!         Reviewed expectations re: course of current medical issues. Questions answered. Outlined signs and symptoms indicating need for more acute intervention. Patient verbalized understanding. After Visit Summary given.   SUBJECTIVE: Pleasant 51 year old female here for evaluation of fever, body aches, nasal congestion.  Symptoms have been present for 3 days.  She reports generalized body aches and sore throat and nasal congestion.  Mild nonproductive cough.  She has tried Tylenol and ibuprofen but no over-the-counter nasal decongestions.  She has no known sick contacts.  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  She has had COVID before couple years ago.  Patient's last menstrual period was 11/24/2021 (exact date). Past Surgical History:  Procedure Laterality Date   AUGMENTATION MAMMAPLASTY Bilateral 2008   OTHER SURGICAL HISTORY     removal  of MRSA lesion 4/06     OBJECTIVE:  Vitals:   12/03/21 0847  BP: 118/80  Pulse: 70  Resp: 18  Temp: 98.2 F (36.8 C)  TempSrc: Oral  SpO2: 97%     Physical Exam Vitals reviewed.  HENT:     Right Ear: Tympanic membrane and ear canal normal.     Left Ear: Tympanic membrane and ear canal normal.     Mouth/Throat:     Pharynx: Posterior oropharyngeal erythema present.     Tonsils: No tonsillar exudate.  Cardiovascular:     Rate and Rhythm: Normal rate.  Heart sounds: Normal heart sounds.  Pulmonary:     Effort: Pulmonary effort is normal.     Breath sounds: Normal breath sounds.  Lymphadenopathy:     Cervical: Cervical adenopathy present.  Skin:    General: Skin is warm.  Neurological:     General: No focal deficit present.  Psychiatric:        Mood and Affect: Mood normal.      Labs: Results for orders placed or performed during the hospital encounter of 12/03/21  POCT Rapid Strep A (ED/UC)  Result Value Ref Range   Streptococcus, Group A  Screen (Direct) NEGATIVE NEGATIVE   Labs Reviewed  SARS CORONAVIRUS 2 (TAT 6-24 HRS)  POCT RAPID STREP A, ED / UC    Imaging: No results found.   No Known Allergies                                             Past Medical History:  Diagnosis Date   Anxiety    GERD (gastroesophageal reflux disease)    in times of stress   Tobacco consumption    quit 2018    Social History   Socioeconomic History   Marital status: Married    Spouse name: Not on file   Number of children: Not on file   Years of education: Not on file   Highest education level: Not on file  Occupational History   Not on file  Tobacco Use   Smoking status: Former    Packs/day: 1.00    Years: 27.00    Total pack years: 27.00    Types: Cigarettes   Smokeless tobacco: Never  Vaping Use   Vaping Use: Every day  Substance and Sexual Activity   Alcohol use: Not Currently   Drug use: No   Sexual activity: Yes    Birth control/protection: None  Other Topics Concern   Not on file  Social History Narrative   Not on file   Social Determinants of Health   Financial Resource Strain: Not on file  Food Insecurity: Not on file  Transportation Needs: Not on file  Physical Activity: Not on file  Stress: Not on file  Social Connections: Not on file  Intimate Partner Violence: Not on file    Family History  Problem Relation Age of Onset   Colon polyps Neg Hx    Colon cancer Neg Hx    Esophageal cancer Neg Hx    Stomach cancer Neg Hx    Rectal cancer Neg Hx       Juliene Kirsh, Dorian Pod, MD 12/03/21 [phone]

## 2021-12-04 ENCOUNTER — Encounter: Payer: Self-pay | Admitting: Family Medicine

## 2021-12-05 ENCOUNTER — Other Ambulatory Visit: Payer: Self-pay | Admitting: Family Medicine

## 2021-12-05 MED ORDER — NIRMATRELVIR/RITONAVIR (PAXLOVID)TABLET: 3.0000 | ORAL_TABLET | Freq: Two times a day (BID) | ORAL | 0 refills | Status: AC

## 2021-12-09 ENCOUNTER — Ambulatory Visit: Admitting: Psychiatry

## 2021-12-30 ENCOUNTER — Ambulatory Visit (INDEPENDENT_AMBULATORY_CARE_PROVIDER_SITE_OTHER): Admitting: Psychiatry

## 2021-12-30 DIAGNOSIS — F411 Generalized anxiety disorder: Secondary | ICD-10-CM

## 2021-12-30 NOTE — Progress Notes (Signed)
    [facility] Counselor/Therapist Progress Note  Patient ID: Tyrena M Spengler, MRN: [id number],    Date: 12/30/2021  Time Spent: 55 minutes   Treatment Type: Individual Therapy  Reported Symptoms: anxiety much improved; no depression, managing her stress and personal/family situations much better, has stopped some habits she has been concerned about for a while  Mental Status Exam:  Appearance:   Neat     Behavior:  Appropriate, Sharing, and Motivated  Motor:  Normal  Speech/Language:   Clear and Coherent  Affect:  appropriate  Mood:  normal  Thought process:  normal  Thought content:    WNL  Sensory/Perceptual disturbances:    WNL  Orientation:  oriented to person, place, time/date, situation, day of week, month of year, year, and stated date of Oct. 9, 2023  Attention:  Good  Concentration:  Good  Memory:  WNL  Fund of knowledge:   Good  Insight:    Good  Judgment:   Good  Impulse Control:  Good   Risk Assessment: Danger to Self:  No Self-injurious Behavior: No Danger to Others: No Duty to Warn:no Physical Aggression / Violence:No  Access to Firearms a concern: No  Gang Involvement:No   Subjective: Patient in today and reporting continued progress on her goals.  Showing good improvement in managing her anxiety much better. Has stopped all smoking and feeling good about this and the positive impact on health. Has progressed in helping manage issues with her children at home and things at home have calmed down. No depression. Good support and problem solving effectively  with kids. She and husband doing well. Not impulsively reacting in the moment. More patience with others and herself. Healthier boundaries. "Really continuing to work on my perspective of people and better managing my temper, my responses to others, and not being rude to others in conversations." Better view of herself as a mother and working hard on trying to let go of things out of my control.  Marriage growing more in positive direction.   Interventions: Cognitive Behavioral Therapy and Ego-Supportive  Treatment goals: Treatment goals remain on treatment plan as patient works with strategies to achieve her goals.  Progress is assessed each session and documented in "subject" and/or "plan" section of treatment note. Long-term goal: Elevate mood and show evidence of usual energy, activities, and socialization level. Short-term goal: Make positive statements regarding self and her ability to cope with life stressors. Strategies: Reinforced positive, reality based cognitive messages that enhance self-confidence and increase adaptive action  Diagnosis:   ICD-10-CM   1. Generalized anxiety disorder  F41.1      Plan:  Patient today showing active participation, sharing meeting of goals and personal growth.  Feeling that she has made some good growth and wanting to maintain her gains as shared in session today.  She and husband have become quite collaborative in their parenting skills focusing on the concern and wellbeing of their children and not trying to force them to be certain ways, as patient shared several examples of this today.  Encouraged patient in her growth and to continue with the gains she has made and feels good about the work she has done for herself and that will impact her children and family.  Encouraged her to continue in her practice of positive behaviors as discussed in prior sessions that help support her on improved emotional health and overall wellbeing.  Goal review and progress noted as patient has met her goals and does not   need to reschedule at this time.  Knows that she can return if needed.  This record has been created using [employer].  Chart creation errors have been sought, but may not always have been located and corrected.  Such creation errors do not reflect on the standard of medical care provided.   Shanon Ace,  LCSW

## 2022-01-09 ENCOUNTER — Ambulatory Visit (INDEPENDENT_AMBULATORY_CARE_PROVIDER_SITE_OTHER): Admitting: Family Medicine

## 2022-01-09 VITALS — BP 130/82 | HR 67 | Ht 65.0 in | Wt 169.0 lb

## 2022-01-09 DIAGNOSIS — M722 Plantar fascial fibromatosis: Secondary | ICD-10-CM

## 2022-01-09 NOTE — Progress Notes (Signed)
Subjective:    Patient ID: Penny Reid, female    DOB: [birth year], 51 y.o.   MRN: [id number]  Patient reports pain in her left foot.  The pain originates at the plantar surface of her calcaneus near the insertion of the Achilles tendon that radiates up to the ball of the foot in a linear pattern.  She describes it as a dull ache.  She denies any sharp stabbing pain.  She denies any falls or injuries.  There is no erythema or swelling.  She works at [employer] and is on her feet for hours at a time on hard linoleum floors.  She is not wearing good supportive footwear. Past Surgical History:  Procedure Laterality Date   AUGMENTATION MAMMAPLASTY Bilateral 2008   OTHER SURGICAL HISTORY     removal  of MRSA lesion 4/06   Current Outpatient Medications on File Prior to Visit  Medication Sig Dispense Refill   B Complex Vitamins (VITAMIN B COMPLEX PO) Take 1 tablet by mouth daily at 6 (six) AM.     CALCIUM PO Take 1 tablet by mouth daily at 6 (six) AM.     meclizine (ANTIVERT) 25 MG tablet Take 1 tablet (25 mg total) by mouth 3 (three) times daily as needed for dizziness. 30 tablet 0   No current facility-administered medications on file prior to visit.   No Known Allergies Social History   Socioeconomic History   Marital status: Married    Spouse name: Not on file   Number of children: Not on file   Years of education: Not on file   Highest education level: Not on file  Occupational History   Not on file  Tobacco Use   Smoking status: Former    Packs/day: 1.00    Years: 27.00    Total pack years: 27.00    Types: Cigarettes   Smokeless tobacco: Never  Vaping Use   Vaping Use: Every day  Substance and Sexual Activity   Alcohol use: Not Currently   Drug use: No   Sexual activity: Yes    Birth control/protection: None  Other Topics Concern   Not on file  Social History Narrative   Not on file   Social Determinants of Health   Financial Resource Strain: Not on  file  Food Insecurity: Not on file  Transportation Needs: Not on file  Physical Activity: Not on file  Stress: Not on file  Social Connections: Not on file  Intimate Partner Violence: Not on file   Patient states that she quit smoking 1 year ago Family History  Problem Relation Age of Onset   Colon polyps Neg Hx    Colon cancer Neg Hx    Esophageal cancer Neg Hx    Stomach cancer Neg Hx    Rectal cancer Neg Hx    She denies any family history of premature cardiovascular disease, colon cancer, breast cancer    Review of Systems  Gastrointestinal:  Positive for abdominal pain.  All other systems reviewed and are negative.      Objective:   Physical Exam Vitals reviewed.  Constitutional:      General: She is not in acute distress.    Appearance: She is well-developed. She is not diaphoretic.  HENT:     Head: Normocephalic and atraumatic.  Neck:     Thyroid: No thyromegaly.     Vascular: No JVD.     Trachea: No tracheal deviation.  Cardiovascular:     Rate  and Rhythm: Normal rate and regular rhythm.     Pulses:          Dorsalis pedis pulses are 2+ on the left side.       Posterior tibial pulses are 2+ on the left side.     Heart sounds: Normal heart sounds. No murmur heard.    No friction rub. No gallop.  Pulmonary:     Effort: Pulmonary effort is normal. No respiratory distress.     Breath sounds: Normal breath sounds. No stridor. No wheezing or rales.  Chest:     Chest wall: No tenderness.  Musculoskeletal:        General: Tenderness present. No swelling, deformity or signs of injury.     Cervical back: Normal range of motion and neck supple.     Left foot: Normal range of motion. No deformity, foot drop or prominent metatarsal heads.       Feet:  Feet:     Left foot:     Skin integrity: Skin integrity normal.  Lymphadenopathy:     Cervical: No cervical adenopathy.  Skin:    General: Skin is warm.     Coloration: Skin is not pale.     Findings: No  erythema or rash.  Neurological:     Mental Status: She is alert.     Motor: No abnormal muscle tone.     Deep Tendon Reflexes: Reflexes are normal and symmetric.           Assessment & Plan:  Plantar fasciitis - Plan: DG Foot Complete Left I believe she is starting to develop plantar fasciitis.  Obtain an x-ray of the foot given the vague symptoms to evaluate for any bone spur or bony abnormality.  I recommended stretches for plantar fasciitis.  I recommended Voltaren gel to be applied as needed 4 times a day.  I recommended good supportive footwear with good arch.  She can also try a night splint for plantar fasciitis

## 2022-01-13 ENCOUNTER — Ambulatory Visit: Admitting: Psychiatry

## 2022-01-16 ENCOUNTER — Ambulatory Visit
Admission: RE | Admit: 2022-01-16 | Discharge: 2022-01-16 | Disposition: A | Discharge status: Home / Self Care | Source: Ambulatory Visit | Attending: Family Medicine | Admitting: Family Medicine

## 2022-01-16 DIAGNOSIS — M722 Plantar fascial fibromatosis: Secondary | ICD-10-CM

## 2023-04-23 IMAGING — MG DIGITAL SCREENING BREAST BILAT IMPLANT W/ TOMO W/ CAD
8 of 12 series · 8 of 28 positions shown · non-contrast
Comparison: Previous exam(s).

CLINICAL DATA: Screening.

EXAM:
DIGITAL SCREENING BILATERAL MAMMOGRAM WITH IMPLANTS, CAD AND
TOMOSYNTHESIS
TECHNIQUE: Bilateral screening digital craniocaudal and mediolateral oblique
mammograms were obtained. Bilateral screening digital breast
tomosynthesis was performed. The images were evaluated with
computer-aided detection. Standard and/or implant displaced views
were performed.

[L CC]
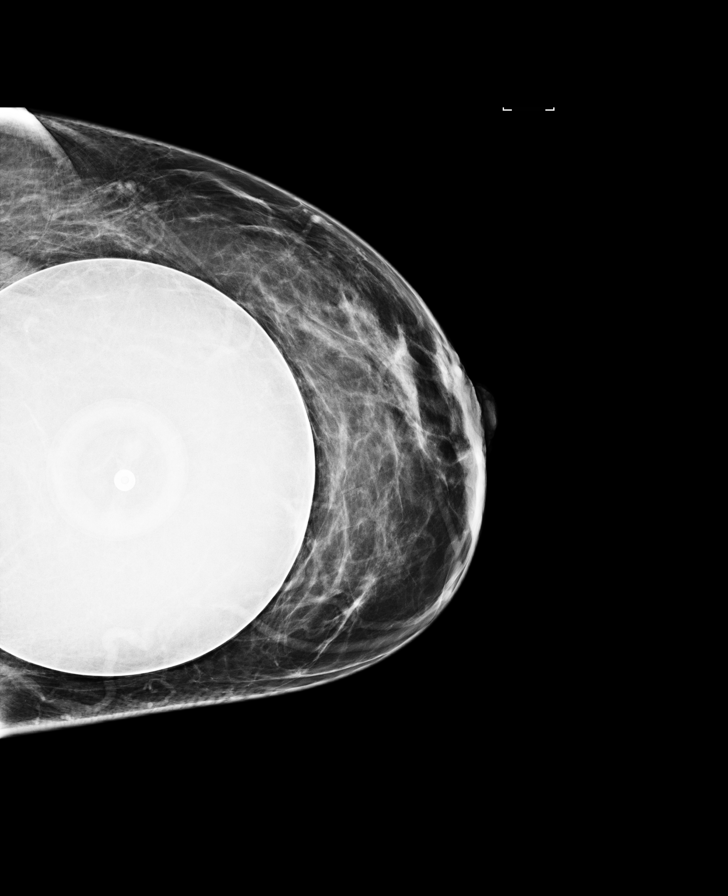

[L MLO]
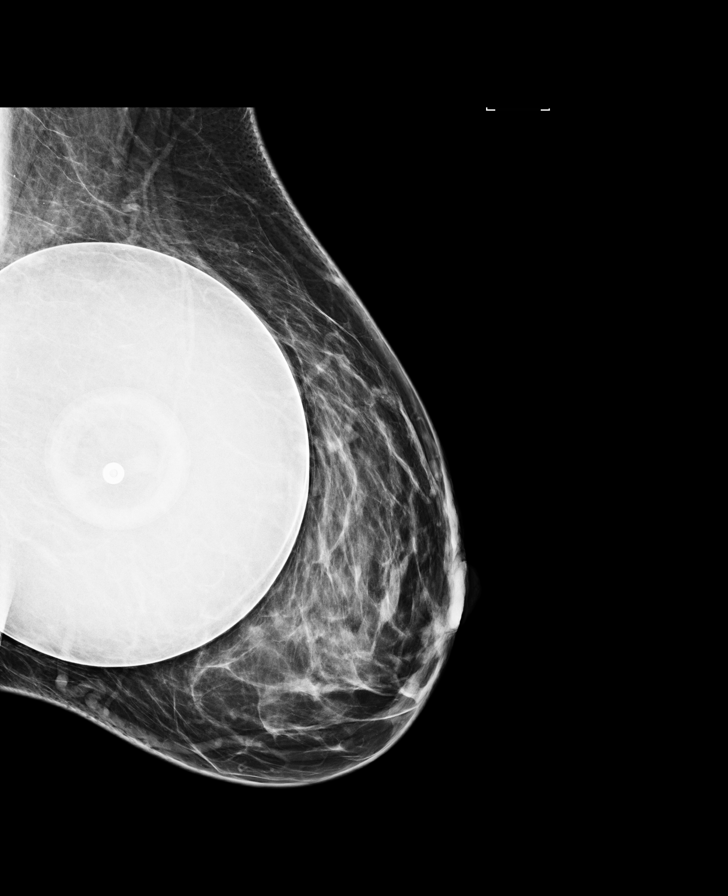

[R CC]
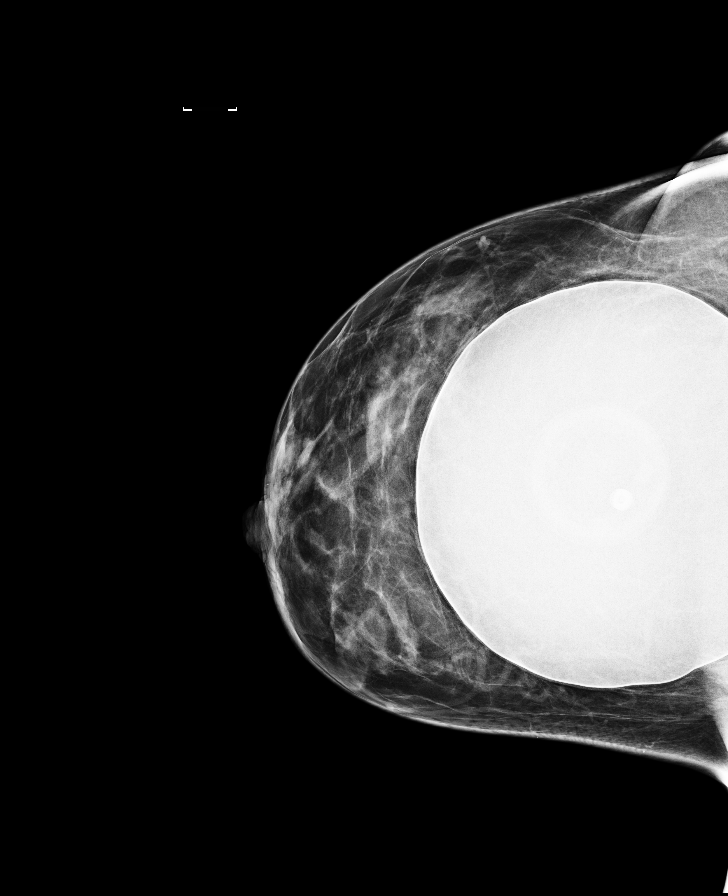

[R MLO]
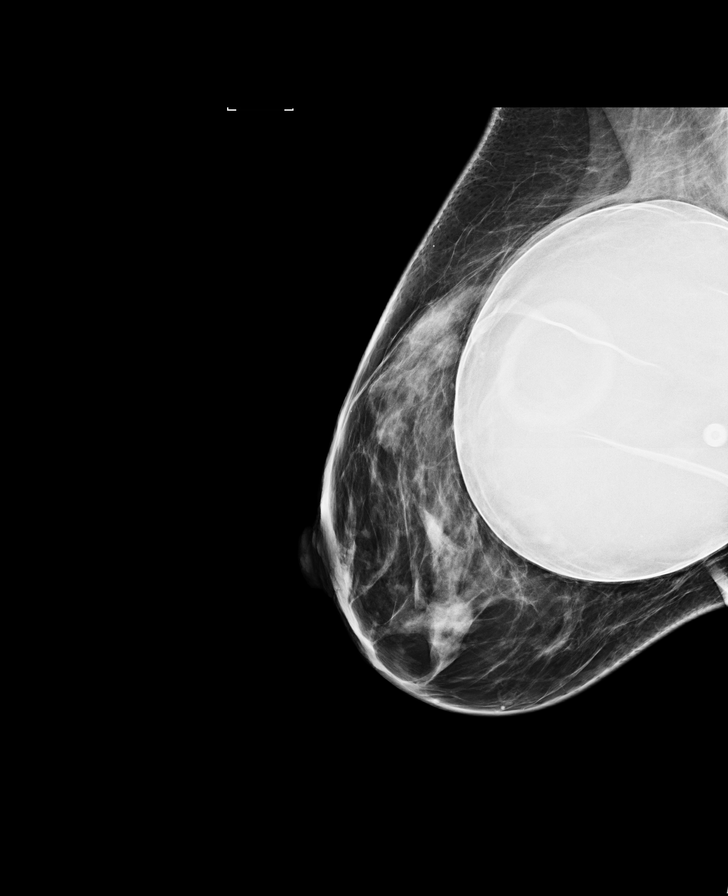

[R CC synth-2D]
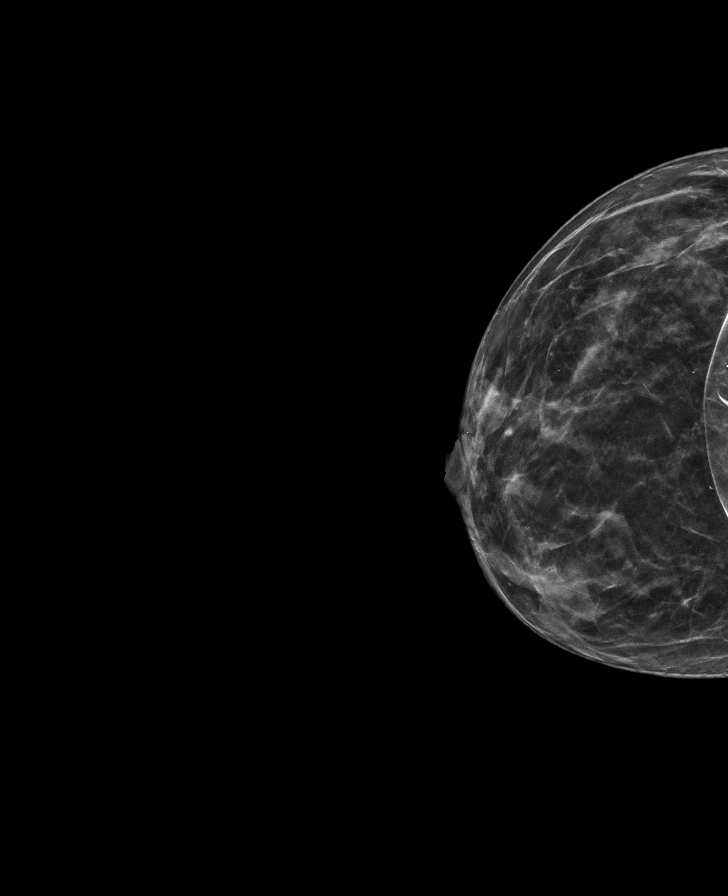

[R MLO synth-2D]
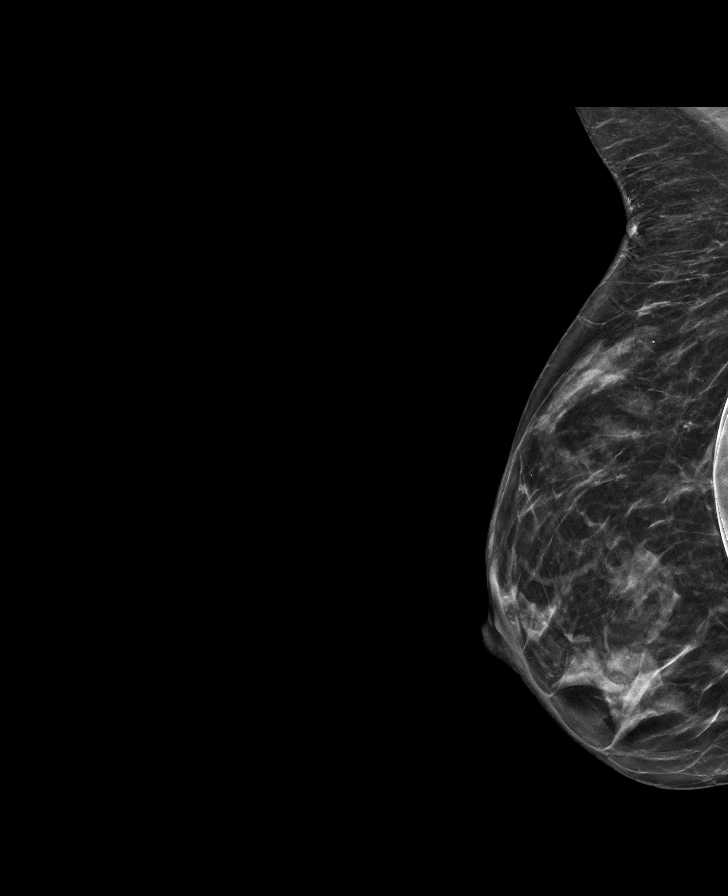

[L MLO synth-2D]
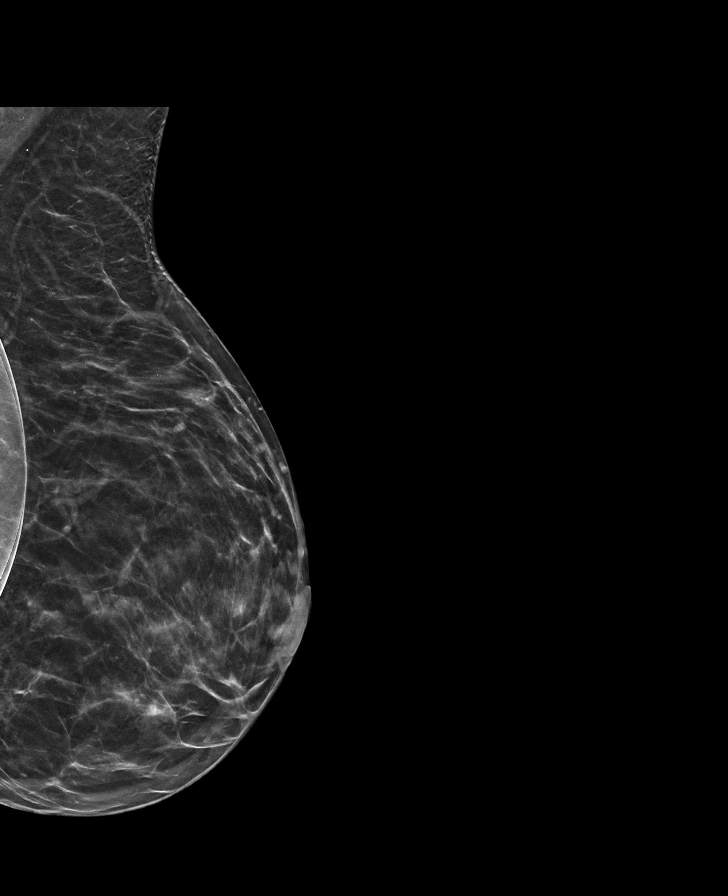

[L CC synth-2D]
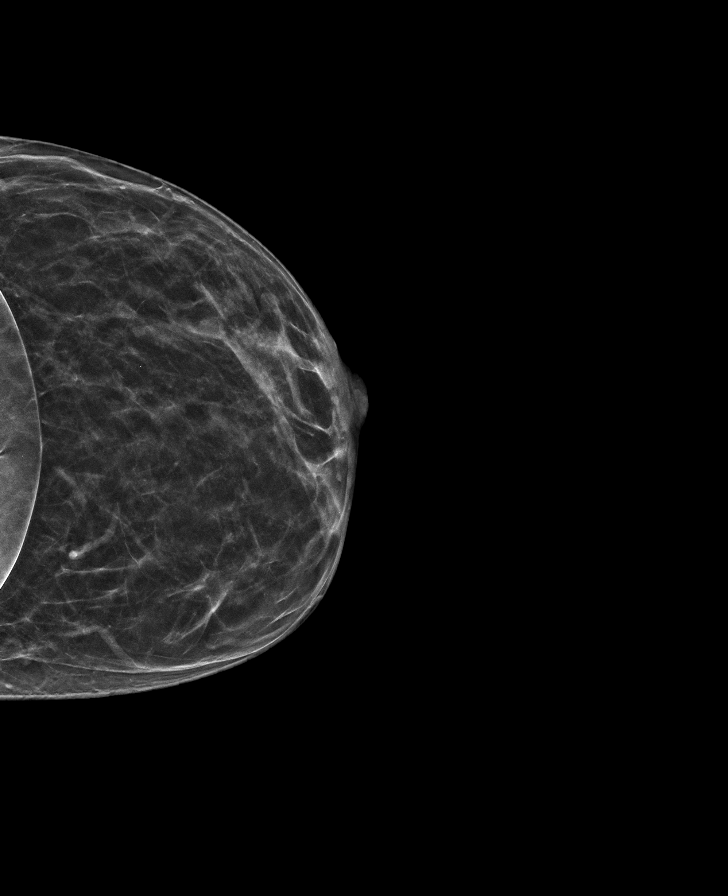

[8 of 28 positions shown; findings below may reference images not displayed]

ACR Breast Density Category b: There are scattered areas of
fibroglandular density.
FINDINGS: The patient has retropectoral implants. There are no findings
suspicious for malignancy.
IMPRESSION: No mammographic evidence of malignancy. A result letter of this
screening mammogram will be mailed directly to the patient.

RECOMMENDATION:
Screening mammogram in one year. (Code:SE-S-JMG)

BI-RADS CATEGORY  1:  Negative.

## 2023-09-09 ENCOUNTER — Ambulatory Visit: Payer: Self-pay

## 2023-09-09 NOTE — Telephone Encounter (Signed)
 FYI Only or Action Required?: FYI only for provider.  Patient was last seen in primary care on 01/09/2022 by Austine Lefort, MD.   Called Nurse Triage reporting Jaw Pain.   Symptoms began a week ago.   Interventions attempted: Other: Antibiotics  Symptoms are: unchanged.  Triage Disposition: See Physician Within 24 Hours  Patient/caregiver understands and will follow disposition?: Yes       Copied from CRM [id number]. Topic: Clinical - Red Word Triage >> Sep 09, 2023 12:40 PM Lysbeth Sauger wrote: Red Word that prompted transfer to Nurse Triage: pain in upper right and lower jaw, dentist said they dont see anything that could be causing it advised her to see pcp Reason for Disposition  [1] MODERATE pain (e.g., interferes with normal activities) AND [2] constant AND [3] present > 24 hours  Answer Assessment - Initial Assessment Questions Patient says her dentist put her on a Z-pack last Friday and finished on 6/17, since he thought she had a sinus infection on one of the nerves causing pain. He referred her to an Endodontist who was seen yesterday. She had a 3D view of face and nothing showed up, no abscess or anything to indicate why the pain is there and referred to PCP. She says it hurts to have anything in the mouth, can chew without pain. Endodontist started on amoxicllin today x 10 days saying it may be a slight infection between teeth near the gum line and if no better call back. Advised first available is not until July with PCP, first available with any provider is on Tuesday 6/24, she agreed to that appt.    1. ONSET: When did the pain start? (e.g., minutes, hours, days)     Since 6/10 2. ONSET: Does the pain come and go, or has it been constant since it started? (e.g., constant, intermittent, fleeting)     Comes and goes 3. SEVERITY: How bad is the pain?   (Scale 1-10; mild, moderate or severe)   - MILD (1-3): doesn't interfere with normal activities    - MODERATE  (4-7): interferes with normal activities or awakens from sleep    - SEVERE (8-10): excruciating pain, unable to do any normal activities      3 at best, worst 7-8/10 4. LOCATION: Where does it hurt?      Right upper and lower jaw 5. RASH: Is there any redness, rash, or swelling of the face?     No rash, minimal swelling 6. FEVER: Do you have a fever? If Yes, ask: What is it, how was it measured, and when did it start?      No 7. OTHER SYMPTOMS: Do you have any other symptoms? (e.g., fever, toothache, nasal discharge, nasal congestion, clicking sensation in jaw joint)     No  Protocols used: Face Pain-A-AH

## 2023-09-14 ENCOUNTER — Ambulatory Visit: Admitting: Family Medicine

## 2023-09-14 ENCOUNTER — Encounter: Payer: Self-pay | Admitting: Family Medicine

## 2023-09-14 VITALS — BP 112/70 | HR 71 | Temp 97.8°F | Resp 18 | Ht 65.0 in | Wt 156.6 lb

## 2023-09-14 DIAGNOSIS — R6884 Jaw pain: Secondary | ICD-10-CM | POA: Diagnosis not present

## 2023-09-14 MED ORDER — METHOCARBAMOL 500 MG PO TABS: 500.0000 mg | ORAL_TABLET | Freq: Three times a day (TID) | ORAL | 1 refills | Status: AC | PRN

## 2023-09-14 MED ORDER — PREDNISONE 10 MG (21) PO TBPK: ORAL_TABLET | ORAL | 0 refills | Status: AC

## 2023-09-14 NOTE — Progress Notes (Signed)
 Patient Office Visit  Assessment & Plan:  Jaw pain -     predniSONE; Use as directed.  Dispense: 21 each; Refill: 0 -     Methocarbamol; Take 1 tablet (500 mg total) by mouth every 8 (eight) hours as needed for muscle spasms.  Dispense: 30 tablet; Refill: 1   Assessment and Plan    Right jaw pain Intermittent right jaw pain radiating to neck and ear, exacerbated by thermal stimuli and mastication. Managed with acetaminophen  and ibuprofen. Antibiotics ineffective. Differential includes TMJ disorder, trigeminal neuralgia, nerve inflammation. Dental issues ruled out. 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 - Prescribe prednisone for 6 days to reduce inflammation. - Prescribe methocarbamol as a muscle relaxant. - Consider gabapentin or pregabalin if pain persists or worsens. - Advise against taking ibuprofen while on prednisone; acetaminophen  is permissible. - Discuss potential referral for further imaging if symptoms do not improve.  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 Jaw popping sound noted. Pain intermittent with TMJ but may contribute to jaw pain. Prednisone and methocarbamol expected to alleviate symptoms if TMJ is contributory. - Prescribe methocarbamol as a muscle relaxant to alleviate muscle tension which she can take during the day - Consider a night guard to prevent bruxism.  Follow-up Will follow up with dentist for cleaning and potential night guard fitting. - Follow up with dentist for cleaning and night guard fitting.      Return if symptoms worsen or fail to improve.   Subjective:    Patient ID: Penny Reid, female    DOB: Nov 18, 1970  Age: 53 y.o. MRN: [id number]  Chief Complaint  Patient presents with   Jaw Pain    HPI Discussed the use of AI scribe software for clinical note transcription with the patient, who gave verbal consent to proceed.  History of Present Illness        Penny Reid is a 53 year old female  who presents with right jaw pain.  She has been experiencing right jaw pain for over a week, initially involving the back upper and lower teeth and radiating down her neck, causing odynophagia. The pain was most intense last Wednesday and Thursday, leading to tears due to its severity. It subsided on Friday night, was absent over the weekend, but returned on Monday.  She has been evaluated by a dentist and an endodontist Dr. Elpidio. The dentist initially prescribed a Z-Pak suspecting a sinus infection, which did not alleviate the symptoms. Subsequently, the endodontist prescribed a 10-day course of augmentin, which she has been taking for five days. She reports some improvement but continues to experience pain.  A regular dental x-ray showed no issues with her fillings, and a 3D x-ray by the endodontist revealed no dental or nerve abnormalities. Despite this, she experiences sensitivity to hot and cold, which has intensified recently. She describes the pain as 'shooting' and 'burning,' particularly when consuming cold or hot substances.  She acknowledges bruxism, more so during the day, and does not chew gum. She has not experienced any issues with brushing or flossing her teeth.  Currently, she manages the pain with Tylenol  and ibuprofen every four to six hours, which provides some relief. She was prescribed a narcotic pain medication, but it caused sedation without effectively managing the pain. She is able to sleep without disturbance from the pain.  No ear pain or drooping of facial muscles. No issues with hearing and no pain upon tapping of the teeth. She experiences a popping sensation in  her jaw, particularly on the right side, but no consistent pain with jaw movement. patient will see the dentist for cleaning this week and mouthguard will be discussed Physical Exam HEENT: Teeth sensitive to percussion. Sensitivity in the right jaw area. Results RADIOLOGY 3D Dental X-ray: Fillings intact, no  visible dental issues, no nerve decay Assessment & Plan Right jaw pain Intermittent right jaw pain radiating to neck and ear, exacerbated by thermal stimuli and mastication. Managed with acetaminophen  and ibuprofen. Antibiotics ineffective. Differential includes TMJ disorder, trigeminal neuralgia, nerve inflammation. Dental issues ruled out. 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 - Prescribe prednisone for 6 days to reduce inflammation. - Prescribe methocarbamol as a muscle relaxant. - Consider gabapentin or pregabalin if pain persists or worsens. - Advise against taking ibuprofen while on prednisone; acetaminophen  is permissible. - Discuss potential referral for further imaging if symptoms do not improve.  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 Jaw popping sound noted. Pain intermittent with TMJ but may contribute to jaw pain. Prednisone and methocarbamol expected to alleviate symptoms if TMJ is contributory. - Prescribe methocarbamol as a muscle relaxant to alleviate muscle tension which she can take during the day - Consider a night guard to prevent bruxism.  Follow-up Will follow up with dentist for cleaning and potential night guard fitting. - Follow up with dentist for cleaning and night guard fitting.     The 10-year ASCVD risk score (Arnett DK, et al., 2019) is: 1%  Past Medical History:  Diagnosis Date   Anxiety    GERD (gastroesophageal reflux disease)    in times of stress   Tobacco consumption    quit 2018   Past Surgical History:  Procedure Laterality Date   AUGMENTATION MAMMAPLASTY Bilateral 2008   OTHER SURGICAL HISTORY     removal  of MRSA lesion 4/06   Social History   Tobacco Use   Smoking status: Former    Current packs/day: 1.00    Average packs/day: 1 pack/day for 27.0 years (27.0 ttl pk-yrs)    Types: Cigarettes   Smokeless tobacco: Never  Vaping Use   Vaping status: Every Day  Substance Use Topics    Alcohol use: Not Currently   Drug use: No   Family History  Problem Relation Age of Onset   Colon polyps Neg Hx    Colon cancer Neg Hx    Esophageal cancer Neg Hx    Stomach cancer Neg Hx    Rectal cancer Neg Hx    No Known Allergies  ROS    Objective:    BP 112/70   Pulse 71   Temp 97.8 F (36.6 C) (Temporal)   Resp 18   Ht 5' 5 (1.651 m)   Wt 156 lb 9.6 oz (71 kg)   LMP 08/22/2023   SpO2 96%   BMI 26.06 kg/m  BP Readings from Last 3 Encounters:  09/14/23 112/70  01/09/22 130/82  12/03/21 118/80   Wt Readings from Last 3 Encounters:  09/14/23 156 lb 9.6 oz (71 kg)  01/09/22 169 lb (76.7 kg)  10/23/21 167 lb (75.8 kg)    Physical Exam Vitals and nursing note reviewed.  Constitutional:      General: She is not in acute distress.    Appearance: Normal appearance.  HENT:     Head: Normocephalic.     Jaw: Tenderness present.     Comments: Patient does have tenderness over the right TMJ and has an audible click.  Left TMJ nontender  to palpation.  Patient is also able to may open her mouth without difficulty.    Right Ear: Tympanic membrane, ear canal and external ear normal.     Left Ear: Tympanic membrane, ear canal and external ear normal.   Eyes:     Extraocular Movements: Extraocular movements intact.     Pupils: Pupils are equal, round, and reactive to light.    Cardiovascular:     Rate and Rhythm: Normal rate and regular rhythm.     Heart sounds: Normal heart sounds.  Pulmonary:     Effort: Pulmonary effort is normal.     Breath sounds: Normal breath sounds. No wheezing.   Musculoskeletal:     Right lower leg: No edema.     Left lower leg: No edema.   Neurological:     General: No focal deficit present.     Mental Status: She is alert and oriented to person, place, and time.     Comments: No facial droop, can raise eyebrows, smile symmetric  Psychiatric:        Mood and Affect: Mood normal.        Behavior: Behavior normal.      No  results found for any visits on 09/14/23.
